# Patient Record
Sex: Female | Born: 1960
Health system: Southern US, Community
[De-identification: ages and names within clinical notes are randomized; demographics above are authoritative.]

## PROBLEM LIST (undated history)

## (undated) DIAGNOSIS — F329 Major depressive disorder, single episode, unspecified: Secondary | ICD-10-CM

## (undated) DIAGNOSIS — F419 Anxiety disorder, unspecified: Secondary | ICD-10-CM

## (undated) DIAGNOSIS — Z86010 Personal history of colonic polyps: Principal | ICD-10-CM

## (undated) DIAGNOSIS — T7840XA Allergy, unspecified, initial encounter: Secondary | ICD-10-CM

## (undated) DIAGNOSIS — J302 Other seasonal allergic rhinitis: Secondary | ICD-10-CM

## (undated) DIAGNOSIS — R7303 Prediabetes: Secondary | ICD-10-CM

## (undated) DIAGNOSIS — F32A Depression, unspecified: Secondary | ICD-10-CM

## (undated) DIAGNOSIS — J45909 Unspecified asthma, uncomplicated: Secondary | ICD-10-CM

## (undated) DIAGNOSIS — E785 Hyperlipidemia, unspecified: Secondary | ICD-10-CM

## (undated) DIAGNOSIS — E559 Vitamin D deficiency, unspecified: Secondary | ICD-10-CM

## (undated) DIAGNOSIS — M1990 Unspecified osteoarthritis, unspecified site: Secondary | ICD-10-CM

## (undated) HISTORY — DX: Unspecified asthma, uncomplicated: J45.909

## (undated) HISTORY — PX: COLONOSCOPY: SHX174

## (undated) HISTORY — DX: Allergy, unspecified, initial encounter: T78.40XA

## (undated) HISTORY — PX: CHEST TUBE INSERTION: SHX231

## (undated) HISTORY — DX: Depression, unspecified: F32.A

## (undated) HISTORY — DX: Personal history of colonic polyps: Z86.010

## (undated) HISTORY — DX: Other seasonal allergic rhinitis: J30.2

## (undated) HISTORY — DX: Prediabetes: R73.03

## (undated) HISTORY — PX: PARTIAL HYSTERECTOMY: SHX80

## (undated) HISTORY — DX: Anxiety disorder, unspecified: F41.9

## (undated) HISTORY — DX: Vitamin D deficiency, unspecified: E55.9

## (undated) HISTORY — DX: Major depressive disorder, single episode, unspecified: F32.9

## (undated) HISTORY — PX: OTHER SURGICAL HISTORY: SHX169

## (undated) HISTORY — DX: Hyperlipidemia, unspecified: E78.5

## (undated) HISTORY — PX: POLYPECTOMY: SHX149

## (undated) HISTORY — DX: Unspecified osteoarthritis, unspecified site: M19.90

---

## 2008-07-27 ENCOUNTER — Emergency Department (HOSPITAL_COMMUNITY): Admission: EM | Admit: 2008-07-27 | Discharge: 2008-07-27 | Payer: Self-pay | Admitting: Emergency Medicine

## 2012-02-16 DIAGNOSIS — Z860100 Personal history of colon polyps, unspecified: Secondary | ICD-10-CM | POA: Insufficient documentation

## 2013-10-14 ENCOUNTER — Other Ambulatory Visit: Payer: Self-pay

## 2013-11-05 ENCOUNTER — Encounter (HOSPITAL_COMMUNITY): Payer: Self-pay | Admitting: Emergency Medicine

## 2013-11-05 ENCOUNTER — Emergency Department (INDEPENDENT_AMBULATORY_CARE_PROVIDER_SITE_OTHER)
Admission: EM | Admit: 2013-11-05 | Discharge: 2013-11-05 | Disposition: A | Discharge status: Home / Self Care | Payer: 59 | Source: Home / Self Care

## 2013-11-05 DIAGNOSIS — S61219A Laceration without foreign body of unspecified finger without damage to nail, initial encounter: Secondary | ICD-10-CM

## 2013-11-05 DIAGNOSIS — M79609 Pain in unspecified limb: Secondary | ICD-10-CM

## 2013-11-05 DIAGNOSIS — M79644 Pain in right finger(s): Secondary | ICD-10-CM

## 2013-11-05 DIAGNOSIS — S61209A Unspecified open wound of unspecified finger without damage to nail, initial encounter: Secondary | ICD-10-CM

## 2013-11-05 DIAGNOSIS — W268XXA Contact with other sharp object(s), not elsewhere classified, initial encounter: Secondary | ICD-10-CM

## 2013-11-05 MED ORDER — BACITRACIN ZINC 500 UNIT/GM EX OINT
TOPICAL_OINTMENT | CUTANEOUS | Status: AC
Start: 2013-11-05 — End: 2013-11-05
  Filled 2013-11-05: qty 0.9

## 2013-11-05 NOTE — Discharge Instructions (Signed)
Laceration Care, Adult °A laceration is a cut or lesion that goes through all layers of the skin and into the tissue just beneath the skin. °TREATMENT  °Some lacerations may not require closure. Some lacerations may not be able to be closed due to an increased risk of infection. It is important to see your caregiver as soon as possible after an injury to minimize the risk of infection and maximize the opportunity for successful closure. °If closure is appropriate, pain medicines may be given, if needed. The wound will be cleaned to help prevent infection. Your caregiver will use stitches (sutures), staples, wound glue (adhesive), or skin adhesive strips to repair the laceration. These tools bring the skin edges together to allow for faster healing and a better cosmetic outcome. However, all wounds will heal with a scar. Once the wound has healed, scarring can be minimized by covering the wound with sunscreen during the day for 1 full year. °HOME CARE INSTRUCTIONS  °For sutures or staples: °· Keep the wound clean and dry. °· If you were given a bandage (dressing), you should change it at least once a day. Also, change the dressing if it becomes wet or dirty, or as directed by your caregiver. °· Wash the wound with soap and water 2 times a day. Rinse the wound off with water to remove all soap. Pat the wound dry with a clean towel. °· After cleaning, apply a thin layer of the antibiotic ointment as recommended by your caregiver. This will help prevent infection and keep the dressing from sticking. °· You may shower as usual after the first 24 hours. Do not soak the wound in water until the sutures are removed. °· Only take over-the-counter or prescription medicines for pain, discomfort, or fever as directed by your caregiver. °· Get your sutures or staples removed as directed by your caregiver. °For skin adhesive strips: °· Keep the wound clean and dry. °· Do not get the skin adhesive strips wet. You may bathe  carefully, using caution to keep the wound dry. °· If the wound gets wet, pat it dry with a clean towel. °· Skin adhesive strips will fall off on their own. You may trim the strips as the wound heals. Do not remove skin adhesive strips that are still stuck to the wound. They will fall off in time. °For wound adhesive: °· You may briefly wet your wound in the shower or bath. Do not soak or scrub the wound. Do not swim. Avoid periods of heavy perspiration until the skin adhesive has fallen off on its own. After showering or bathing, gently pat the wound dry with a clean towel. °· Do not apply liquid medicine, cream medicine, or ointment medicine to your wound while the skin adhesive is in place. This may loosen the film before your wound is healed. °· If a dressing is placed over the wound, be careful not to apply tape directly over the skin adhesive. This may cause the adhesive to be pulled off before the wound is healed. °· Avoid prolonged exposure to sunlight or tanning lamps while the skin adhesive is in place. Exposure to ultraviolet light in the first year will darken the scar. °· The skin adhesive will usually remain in place for 5 to 10 days, then naturally fall off the skin. Do not pick at the adhesive film. °You may need a tetanus shot if: °· You cannot remember when you had your last tetanus shot. °· You have never had a tetanus   shot. If you get a tetanus shot, your arm may swell, get red, and feel warm to the touch. This is common and not a problem. If you need a tetanus shot and you choose not to have one, there is a rare chance of getting tetanus. Sickness from tetanus can be serious. SEEK MEDICAL CARE IF:   You have redness, swelling, or increasing pain in the wound.  You see a red line that goes away from the wound.  You have yellowish-white fluid (pus) coming from the wound.  You have a fever.  You notice a bad smell coming from the wound or dressing.  Your wound breaks open before or  after sutures have been removed.  You notice something coming out of the wound such as wood or glass.  Your wound is on your hand or foot and you cannot move a finger or toe. SEEK IMMEDIATE MEDICAL CARE IF:   Your pain is not controlled with prescribed medicine.  You have severe swelling around the wound causing pain and numbness or a change in color in your arm, hand, leg, or foot.  Your wound splits open and starts bleeding.  You have worsening numbness, weakness, or loss of function of any joint around or beyond the wound.  You develop painful lumps near the wound or on the skin anywhere on your body. MAKE SURE YOU:   Understand these instructions.  Will watch your condition.  Will get help right away if you are not doing well or get worse. Document Released: 02/05/2005 Document Revised: 04/30/2011 Document Reviewed: 08/01/2010 [facility] Patient Information 2015 [place], [place]. This information is not intended to replace advice given to you by your health care provider. Make sure you discuss any questions you have with your health care provider.   Keep covered for 24 hours, then may wash and keep covered with bandaid.

## 2013-11-05 NOTE — ED Provider Notes (Signed)
CSN: [id number]     Arrival date & time 11/05/13  1956 History   None    Chief Complaint  Patient presents with  . Laceration   (Consider location/radiation/quality/duration/timing/severity/associated sxs/prior Treatment) HPI Comments: Patient presents with a right middle finger laceration on distal finger pad. Cut on glass today. "Bleeding would not stop".   Patient is a 53 y.o. female presenting with skin laceration. The history is provided by the patient.  Laceration   History reviewed. No pertinent past medical history. History reviewed. No pertinent past surgical history. No family history on file. History  Substance Use Topics  . Smoking status: Not on file  . Smokeless tobacco: Not on file  . Alcohol Use: Not on file   OB History   Grav Para Term Preterm Abortions TAB SAB Ect Mult Living                 Review of Systems  All other systems reviewed and are negative.   Allergies  Review of patient's allergies indicates no known allergies.  Home Medications   Prior to Admission medications   Not on File   BP 137/76  Pulse 84  Temp(Src) 98.9 F (37.2 C) (Oral)  Resp 14  SpO2 100% Physical Exam  Nursing note and vitals reviewed. Constitutional: She is oriented to person, place, and time. She appears well-developed and well-nourished. No distress.  Neurological: She is alert and oriented to person, place, and time.  Skin: Skin is warm and dry. No rash noted. She is not diaphoretic.  Superficial slice laceration to right middle finger, not currently bleeding. Sensation intact.   Psychiatric: Her behavior is normal.    ED Course  Procedures (including critical care time) Labs Review Labs Reviewed - No data to display  Imaging Review No results found.   MDM   1. Finger laceration, initial encounter   2. Pain of finger of right hand   Superficial, Treat with bandage cover. F/U if worsens or concerns.     Bjorn Pippin, PA-C 11/05/13 2049

## 2013-11-05 NOTE — ED Notes (Signed)
Right middle finger injury, cut finger on glass picture frame, initially would not stop bleeding.  Injury is an avulsion of tissue.  Bleeding is controlled.

## 2013-11-06 NOTE — ED Provider Notes (Signed)
Medical screening examination/treatment/procedure(s) were performed by a resident physician or non-physician practitioner and as the supervising physician I was immediately available for consultation/collaboration.  Lynne Leader, MD    Gregor Hams, MD 11/06/13 [phone]

## 2014-02-25 ENCOUNTER — Other Ambulatory Visit: Payer: Self-pay

## 2014-02-25 DIAGNOSIS — Z1231 Encounter for screening mammogram for malignant neoplasm of breast: Secondary | ICD-10-CM

## 2014-03-08 ENCOUNTER — Ambulatory Visit
Admission: RE | Admit: 2014-03-08 | Discharge: 2014-03-08 | Disposition: A | Discharge status: Home / Self Care | Payer: 59 | Source: Ambulatory Visit

## 2014-03-08 DIAGNOSIS — Z1231 Encounter for screening mammogram for malignant neoplasm of breast: Secondary | ICD-10-CM

## 2014-03-11 ENCOUNTER — Other Ambulatory Visit: Payer: Self-pay | Admitting: Obstetrics and Gynecology

## 2014-03-11 DIAGNOSIS — R928 Other abnormal and inconclusive findings on diagnostic imaging of breast: Secondary | ICD-10-CM

## 2014-03-23 ENCOUNTER — Ambulatory Visit
Admission: RE | Admit: 2014-03-23 | Discharge: 2014-03-23 | Disposition: A | Discharge status: Home / Self Care | Payer: 59 | Source: Ambulatory Visit | Attending: Obstetrics and Gynecology | Admitting: Obstetrics and Gynecology

## 2014-03-23 DIAGNOSIS — R928 Other abnormal and inconclusive findings on diagnostic imaging of breast: Secondary | ICD-10-CM

## 2015-03-03 ENCOUNTER — Telehealth: Payer: Self-pay | Admitting: Internal Medicine

## 2015-03-03 NOTE — Telephone Encounter (Signed)
Received colon and pathology report for patient. Records placed on Dr. Celesta Aver desk for review.

## 2015-04-05 ENCOUNTER — Encounter: Payer: Self-pay | Admitting: Internal Medicine

## 2015-04-27 ENCOUNTER — Other Ambulatory Visit: Payer: Self-pay

## 2015-04-27 DIAGNOSIS — Z1231 Encounter for screening mammogram for malignant neoplasm of breast: Secondary | ICD-10-CM

## 2015-05-24 NOTE — Telephone Encounter (Signed)
Appointment scheduled.

## 2015-06-01 ENCOUNTER — Ambulatory Visit (AMBULATORY_SURGERY_CENTER): Payer: Self-pay | Admitting: *Deleted

## 2015-06-01 VITALS — Ht 62.0 in | Wt 127.0 lb

## 2015-06-01 DIAGNOSIS — Z86010 Personal history of colonic polyps: Secondary | ICD-10-CM

## 2015-06-01 MED ORDER — BISACODYL 5 MG PO TBEC: DELAYED_RELEASE_TABLET | ORAL | Status: DC

## 2015-06-01 MED ORDER — POLYETHYLENE GLYCOL 3350 17 GM/SCOOP PO POWD: ORAL | Status: DC

## 2015-06-01 NOTE — Progress Notes (Signed)
No egg or soy allergy. No anesthesia problems.  No home O2.  No diet meds.  Medications sent in to pharmacy per pt request, so flex plan would pay.  Emmi given.

## 2015-06-03 ENCOUNTER — Ambulatory Visit
Admission: RE | Admit: 2015-06-03 | Discharge: 2015-06-03 | Disposition: A | Discharge status: Home / Self Care | Payer: Managed Care, Other (non HMO) | Source: Ambulatory Visit

## 2015-06-03 DIAGNOSIS — Z1231 Encounter for screening mammogram for malignant neoplasm of breast: Secondary | ICD-10-CM

## 2015-06-15 ENCOUNTER — Encounter: Payer: Self-pay | Admitting: Internal Medicine

## 2015-06-20 ENCOUNTER — Encounter: Payer: Self-pay | Admitting: Internal Medicine

## 2015-06-20 ENCOUNTER — Ambulatory Visit (AMBULATORY_SURGERY_CENTER): Payer: Managed Care, Other (non HMO) | Admitting: Internal Medicine

## 2015-06-20 VITALS — BP 128/76 | HR 76 | Temp 98.2°F | Resp 16 | Ht 62.0 in | Wt 127.0 lb

## 2015-06-20 DIAGNOSIS — D122 Benign neoplasm of ascending colon: Secondary | ICD-10-CM

## 2015-06-20 DIAGNOSIS — K635 Polyp of colon: Secondary | ICD-10-CM

## 2015-06-20 DIAGNOSIS — Z860100 Personal history of colon polyps, unspecified: Secondary | ICD-10-CM

## 2015-06-20 DIAGNOSIS — D125 Benign neoplasm of sigmoid colon: Secondary | ICD-10-CM

## 2015-06-20 HISTORY — DX: Personal history of colonic polyps: Z86.010

## 2015-06-20 HISTORY — DX: Personal history of colon polyps, unspecified: Z86.0100

## 2015-06-20 MED ORDER — SODIUM CHLORIDE 0.9 % IV SOLN: 500.0000 mL | INTRAVENOUS | Status: DC

## 2015-06-20 NOTE — Progress Notes (Signed)
Repoort given to PACU RN, vss

## 2015-06-20 NOTE — Progress Notes (Signed)
Called to room to assist during endoscopic procedure.  Patient ID and intended procedure confirmed with present staff. Received instructions for my participation in the procedure from the performing physician.  

## 2015-06-20 NOTE — Patient Instructions (Signed)
YOU HAD AN ENDOSCOPIC PROCEDURE TODAY AT THE [facility]:   Refer to the procedure report that was given to you for any specific questions about what was found during the examination.  If the procedure report does not answer your questions, please call your gastroenterologist to clarify.  If you requested that your care partner not be given the details of your procedure findings, then the procedure report has been included in a sealed envelope for you to review at your convenience later.  YOU SHOULD EXPECT: Some feelings of bloating in the abdomen. Passage of more gas than usual.  Walking can help get rid of the air that was put into your GI tract during the procedure and reduce the bloating. If you had a lower endoscopy (such as a colonoscopy or flexible sigmoidoscopy) you may notice spotting of blood in your stool or on the toilet paper. If you underwent a bowel prep for your procedure, you may not have a normal bowel movement for a few days.  Please Note:  You might notice some irritation and congestion in your nose or some drainage.  This is from the oxygen used during your procedure.  There is no need for concern and it should clear up in a day or so.  SYMPTOMS TO REPORT IMMEDIATELY:   Following lower endoscopy (colonoscopy or flexible sigmoidoscopy):  Excessive amounts of blood in the stool  Significant tenderness or worsening of abdominal pains  Swelling of the abdomen that is new, acute  Fever of 100F or higher   For urgent or emergent issues, a gastroenterologist can be reached at any hour by calling [phone].   DIET: Your first meal following the procedure should be a small meal and then it is ok to progress to your normal diet. Heavy or fried foods are harder to digest and may make you feel nauseous or bloated.  Likewise, meals heavy in dairy and vegetables can increase bloating.  Drink plenty of fluids but you should avoid alcoholic beverages for 24  hours.  ACTIVITY:  You should plan to take it easy for the rest of today and you should NOT DRIVE or use heavy machinery until tomorrow (because of the sedation medicines used during the test).    FOLLOW UP: Our staff will call the number listed on your records the next business day following your procedure to check on you and address any questions or concerns that you may have regarding the information given to you following your procedure. If we do not reach you, we will leave a message.  However, if you are feeling well and you are not experiencing any problems, there is no need to return our call.  We will assume that you have returned to your regular daily activities without incident.  If any biopsies were taken you will be contacted by phone or by letter within the next 1-3 weeks.  Please call us at [phone] if you have not heard about the biopsies in 3 weeks.    SIGNATURES/CONFIDENTIALITY: You and/or your care partner have signed paperwork which will be entered into your electronic medical record.  These signatures attest to the fact that that the information above on your After Visit Summary has been reviewed and is understood.  Full responsibility of the confidentiality of this discharge information lies with you and/or your care-partner.     Handout was given to your care partner on polyps. You may resume your current medications today. Await biopsy results. Please call   if any questions or concerns.   

## 2015-06-20 NOTE — Op Note (Addendum)
[facility] Patient Name: Kathryn Garcia Procedure Date: 06/20/2015 8:13 AM MRN: [id number] Endoscopist: Gatha Mayer , MD Age: 55 Date of Birth: 07/28/1960 Gender: Female Procedure:                Colonoscopy Indications:              High risk colon cancer surveillance: Personal                            history of colonic polyps Medicines:                Propofol per Anesthesia, Monitored Anesthesia Care Procedure:                Pre-Anesthesia Assessment:                           - Prior to the procedure, a History and Physical                            was performed, and patient medications and                            allergies were reviewed. The patient's tolerance of                            previous anesthesia was also reviewed. The risks                            and benefits of the procedure and the sedation                            options and risks were discussed with the patient.                            All questions were answered, and informed consent                            was obtained. Prior Anticoagulants: The patient has                            taken no previous anticoagulant or antiplatelet                            agents. ASA Grade Assessment: II - A patient with                            mild systemic disease. After reviewing the risks                            and benefits, the patient was deemed in                            satisfactory condition to undergo the procedure.  After obtaining informed consent, the colonoscope                            was passed under direct vision. Throughout the                            procedure, the patient's blood pressure, pulse, and                            oxygen saturations were monitored continuously. The                            Model CF-HQ190L [id number]) scope was introduced                            through the anus and advanced to the the cecum,                             identified by appendiceal orifice and ileocecal                            valve. The ileocecal valve and the rectum were                            photographed. The quality of the bowel preparation                            was excellent. The colonoscopy was performed                            without difficulty. The patient tolerated the                            procedure well. The bowel preparation used was                            Miralax. Scope In: 8:29:03 AM Scope Out: 8:39:52 AM Scope Withdrawal Time: 0 hours 9 minutes 10 seconds  Total Procedure Duration: 0 hours 10 minutes 49 seconds  Findings:                 Two sessile polyps were found in the ascending                            colon. The polyps were 3 to 4 mm in size. These                            polyps were removed with a cold snare. Resection                            and retrieval were complete. Verification of                            patient identification for the specimen was done.  Estimated blood loss was minimal.                           A 2 mm polyp was found in the sigmoid colon. The                            polyp was sessile. The polyp was removed with a                            cold biopsy forceps. Resection and retrieval were                            complete. Verification of patient identification                            for the specimen was done. Estimated blood loss was                            minimal.                           The exam was otherwise without abnormality on                            direct and retroflexion views. Complications:            No immediate complications. Estimated blood loss:                            None. Estimated Blood Loss:     Estimated blood loss: none. Impression:               - Two 3 to 4 mm polyps in the ascending colon,                            removed with a cold snare. Resected and  retrieved.                           - One 2 mm polyp in the sigmoid colon, removed with                            a cold biopsy forceps. Resected and retrieved.                           - The examination was otherwise normal on direct                            and retroflexion views. Recommendation:           - Patient has a contact number available for                            emergencies. The signs and symptoms of potential  delayed complications were discussed with the                            patient. Return to normal activities tomorrow.                            Written discharge instructions were provided to the                            patient.                           - Resume previous diet.                           - Continue present medications.                           - Repeat colonoscopy is recommended for                            surveillance. SHE HAS HX 12 MM RECTAL ADENOMA 2013                            The colonoscopy date will be determined after                            pathology results from today's exam become                            available for review. Gatha Mayer, MD 06/20/2015 8:46:48 AM This report has been signed electronically. CC Letter to:             Velna Hatchet Addendum Number: 1   Addendum Date: 06/20/2015 3:22:50 PM      Normal perianal and rectal exams Gatha Mayer, MD 06/20/2015 3:23:02 PM This report has been signed electronically.

## 2015-06-20 NOTE — Progress Notes (Signed)
No problems noted in the recovery room. maw 

## 2015-06-21 ENCOUNTER — Telehealth: Payer: Self-pay | Admitting: *Deleted

## 2015-06-21 NOTE — Telephone Encounter (Signed)
  Follow up Call-  Call back number 06/20/2015  Post procedure Call Back phone  # [phone]  Permission to leave phone message Yes     Patient questions:  Do you have a fever, pain , or abdominal swelling? No. Pain Score  0 *  Have you tolerated food without any problems? Yes.    Have you been able to return to your normal activities? Yes.    Do you have any questions about your discharge instructions: Diet   No. Medications  No. Follow up visit  No.  Do you have questions or concerns about your Care? No.  Actions: * If pain score is 4 or above: No action needed, pain <4.

## 2015-06-26 ENCOUNTER — Encounter: Payer: Self-pay | Admitting: Internal Medicine

## 2015-06-26 NOTE — Progress Notes (Signed)
Quick Note:  1 diminutive adenoma and 2 mucosal polyps recall colonoscopy 2022 ______

## 2015-07-27 ENCOUNTER — Other Ambulatory Visit (HOSPITAL_COMMUNITY): Payer: Self-pay | Admitting: Internal Medicine

## 2015-07-27 ENCOUNTER — Ambulatory Visit (HOSPITAL_COMMUNITY)
Admission: RE | Admit: 2015-07-27 | Discharge: 2015-07-27 | Disposition: A | Discharge status: Home / Self Care | Payer: Managed Care, Other (non HMO) | Source: Ambulatory Visit | Attending: Vascular Surgery | Admitting: Vascular Surgery

## 2015-07-27 DIAGNOSIS — R600 Localized edema: Secondary | ICD-10-CM

## 2015-07-27 DIAGNOSIS — F329 Major depressive disorder, single episode, unspecified: Secondary | ICD-10-CM | POA: Insufficient documentation

## 2015-07-27 DIAGNOSIS — F419 Anxiety disorder, unspecified: Secondary | ICD-10-CM | POA: Insufficient documentation

## 2015-08-24 ENCOUNTER — Encounter (HOSPITAL_COMMUNITY): Payer: Managed Care, Other (non HMO)

## 2016-04-02 ENCOUNTER — Encounter (INDEPENDENT_AMBULATORY_CARE_PROVIDER_SITE_OTHER): Payer: Self-pay

## 2016-04-02 ENCOUNTER — Ambulatory Visit (INDEPENDENT_AMBULATORY_CARE_PROVIDER_SITE_OTHER): Payer: BLUE CROSS/BLUE SHIELD | Admitting: Internal Medicine

## 2016-04-02 ENCOUNTER — Encounter: Payer: Self-pay | Admitting: Internal Medicine

## 2016-04-02 VITALS — BP 128/80 | HR 84 | Ht 62.0 in | Wt 134.1 lb

## 2016-04-02 DIAGNOSIS — R140 Abdominal distension (gaseous): Secondary | ICD-10-CM | POA: Diagnosis not present

## 2016-04-02 DIAGNOSIS — R635 Abnormal weight gain: Secondary | ICD-10-CM

## 2016-04-02 NOTE — Patient Instructions (Addendum)
  You have been scheduled for an abdominal ultrasound at [facility] on 04/12/16 at 8:00AM. Please arrive 20 minutes prior to your appointment for registration. Make certain not to have anything to eat or drink 6 hours prior to your appointment. Should you need to reschedule your appointment, please contact radiology at [phone].  Bring a snack and water bottle at least 32 oz's.  They will do abdominal u/s first and then 1.5 hours later they will do the transvaginal ultrasound.  There address is :  [facility], [address] [place]  , [address]    I appreciate the opportunity to care for you. Silvano Rusk, MD, FACG     CALL HER HOME/MOBILE # WITH RESULTS PLEASE.

## 2016-04-02 NOTE — Progress Notes (Signed)
[place]ia 56 y.o. [birth year] [id number]  Assessment & Plan:    Encounter Diagnoses  Name Primary?  . Abdominal bloating Yes  . Weight gain    She is quite distressed about bloating and abdominal distention. Some weight gain. I reassured as best I can. I think given her c/o and concerns an abdominal + pelvic US to rule out ascites or ovarian lesions reasonable.   She has lower abdominal adipose tissue and it may be that is what is causing her problems.  Probiotic reasonable to try if studies negative.  2017 colonoscopy 1 diminutive adenoma  Cc;HOLWERDA, SCOTT, MD     Subjective:   Chief Complaint: bloating  HPI Here w/ c/o chronic bloating sxs. Variable abdominal girth throughout the day in no particular pattern. PCP suggested probiotics - she wanted an evaluation and came to see me. Last colonoscopy 7-8 mos ago - diminutive adenoma. No bowel changes, excess belching or flatus. Gas-ex no help.s/p hysterectomy but no oopherectomy.She is very distressed by these sxs. No new medications. Moves bowels several x a week but not qd and never has.  Wt Readings from Last 3 Encounters:  04/02/16 134 lb 2 oz (60.8 kg)  06/20/15 127 lb (57.6 kg)  06/01/15 127 lb (57.6 kg)   Current Meds  Medication Sig  . albuterol (PROVENTIL HFA;VENTOLIN HFA) 108 (90 Base) MCG/ACT inhaler Inhale 1 puff into the lungs every 6 (six) hours as needed for wheezing or shortness of breath.  . ALPRAZolam (XANAX) 0.25 MG tablet Take 0.25 mg by mouth 2 (two) times daily as needed for anxiety.  Marland Kitchen atorvastatin (LIPITOR) 10 MG tablet daily.  . cholecalciferol (VITAMIN D) 1000 units tablet Take 2,000 Units by mouth daily.  . Emollient (EUCERIN CALMING DAILY MOIST) CREA as needed.  . fluticasone (FLONASE) 50 MCG/ACT nasal spray Place into both nostrils daily.  Marland Kitchen loratadine (CLARITIN) 10 MG tablet Take 10 mg by mouth daily.  . Multiple Vitamins-Minerals (MULTIVITAMIN ADULT PO) Take by  mouth.  . sertraline (ZOLOFT) 100 MG tablet daily.   No Known Allergies  Past Medical History:  Diagnosis Date  . Anxiety   . Arthritis    knees  . Asthma   . Depression   . Personal history of colonic polyps 06/20/2015  . Seasonal allergies   . Vitamin D deficiency    Past Surgical History:  Procedure Laterality Date  . COLONOSCOPY    . PARTIAL HYSTERECTOMY      Review of Systems As above  Objective:   Physical Exam BP 128/80   Pulse 84   Ht 5\' 2"  (1.575 m)   Wt 134 lb 2 oz (60.8 kg)   BMI 24.53 kg/m  Eyes are anicteric Abdomen is soft - lower abdomen protrudes> upper abdomen w/ adipose tissue - midline lwer abd scar no hernia and she is nontedner No peripheral edema She is alert and oriented x3 Somewhat anxious I think

## 2016-04-12 ENCOUNTER — Other Ambulatory Visit: Payer: BLUE CROSS/BLUE SHIELD

## 2016-06-25 ENCOUNTER — Emergency Department (HOSPITAL_COMMUNITY)
Admission: EM | Admit: 2016-06-25 | Discharge: 2016-06-26 | Disposition: A | Discharge status: Home / Self Care | Payer: BLUE CROSS/BLUE SHIELD | Attending: Emergency Medicine | Admitting: Emergency Medicine

## 2016-06-25 ENCOUNTER — Encounter (HOSPITAL_COMMUNITY): Payer: Self-pay

## 2016-06-25 DIAGNOSIS — Z79899 Other long term (current) drug therapy: Secondary | ICD-10-CM | POA: Insufficient documentation

## 2016-06-25 DIAGNOSIS — R0602 Shortness of breath: Secondary | ICD-10-CM | POA: Diagnosis not present

## 2016-06-25 DIAGNOSIS — J939 Pneumothorax, unspecified: Secondary | ICD-10-CM | POA: Diagnosis not present

## 2016-06-25 DIAGNOSIS — R9389 Abnormal findings on diagnostic imaging of other specified body structures: Secondary | ICD-10-CM

## 2016-06-25 DIAGNOSIS — J4541 Moderate persistent asthma with (acute) exacerbation: Secondary | ICD-10-CM | POA: Insufficient documentation

## 2016-06-25 DIAGNOSIS — J439 Emphysema, unspecified: Secondary | ICD-10-CM

## 2016-06-25 DIAGNOSIS — R918 Other nonspecific abnormal finding of lung field: Secondary | ICD-10-CM | POA: Insufficient documentation

## 2016-06-25 DIAGNOSIS — R0600 Dyspnea, unspecified: Secondary | ICD-10-CM | POA: Diagnosis not present

## 2016-06-25 LAB — CBC WITH DIFFERENTIAL/PLATELET
Basophils Absolute: 0 10*3/uL (ref 0.0–0.1)
Basophils Relative: 0 %
EOS PCT: 3 %
Eosinophils Absolute: 0.3 10*3/uL (ref 0.0–0.7)
HCT: 40.8 % (ref 36.0–46.0)
Hemoglobin: 13 g/dL (ref 12.0–15.0)
LYMPHS ABS: 2.2 10*3/uL (ref 0.7–4.0)
LYMPHS PCT: 27 %
MCH: 27.2 pg (ref 26.0–34.0)
MCHC: 31.9 g/dL (ref 30.0–36.0)
MCV: 85.4 fL (ref 78.0–100.0)
Monocytes Absolute: 0.5 10*3/uL (ref 0.1–1.0)
Monocytes Relative: 6 %
Neutro Abs: 5.2 10*3/uL (ref 1.7–7.7)
Neutrophils Relative %: 64 %
PLATELETS: 213 10*3/uL (ref 150–400)
RBC: 4.78 MIL/uL (ref 3.87–5.11)
RDW: 13.9 % (ref 11.5–15.5)
WBC: 8.1 10*3/uL (ref 4.0–10.5)

## 2016-06-25 LAB — PROTIME-INR
INR: 0.99
Prothrombin Time: 13.1 seconds (ref 11.4–15.2)

## 2016-06-25 LAB — BASIC METABOLIC PANEL
Anion gap: 9 (ref 5–15)
BUN: 16 mg/dL (ref 6–20)
CHLORIDE: 109 mmol/L (ref 101–111)
CO2: 24 mmol/L (ref 22–32)
Calcium: 9.7 mg/dL (ref 8.9–10.3)
Creatinine, Ser: 0.77 mg/dL (ref 0.44–1.00)
GFR calc Af Amer: >60 mL/min (ref >60)
GFR calc non Af Amer: >60 mL/min (ref >60)
GLUCOSE: 120 mg/dL — AB (ref 65–99)
POTASSIUM: 4.5 mmol/L (ref 3.5–5.1)
SODIUM: 142 mmol/L (ref 135–145)

## 2016-06-25 MED ORDER — IPRATROPIUM-ALBUTEROL 0.5-2.5 (3) MG/3ML IN SOLN
3.0000 mL | Freq: Once | RESPIRATORY_TRACT | Status: AC
  Administered 2016-06-25: 3 mL via RESPIRATORY_TRACT
  Filled 2016-06-25: qty 3

## 2016-06-25 MED ORDER — IOPAMIDOL (ISOVUE-370) INJECTION 76%
INTRAVENOUS | Status: AC
  Administered 2016-06-26: 100 mL
  Filled 2016-06-25: qty 100

## 2016-06-25 NOTE — ED Triage Notes (Signed)
Pt arrived via [facility] from [facility] c/o SOB pt arrives unlabored on 2L O2 [place].

## 2016-06-25 NOTE — ED Notes (Signed)
Pt has chest x-ray on disk at bedside from urgent care

## 2016-06-26 ENCOUNTER — Emergency Department (HOSPITAL_COMMUNITY): Payer: BLUE CROSS/BLUE SHIELD

## 2016-06-26 DIAGNOSIS — R0600 Dyspnea, unspecified: Secondary | ICD-10-CM | POA: Diagnosis not present

## 2016-06-26 LAB — BRAIN NATRIURETIC PEPTIDE: B NATRIURETIC PEPTIDE 5: 5.5 pg/mL (ref 0.0–100.0)

## 2016-06-26 LAB — TROPONIN I: Troponin I: <0.03 ng/mL

## 2016-06-26 MED ORDER — ALBUTEROL SULFATE HFA 108 (90 BASE) MCG/ACT IN AERS: 1.0000 | INHALATION_SPRAY | Freq: Four times a day (QID) | RESPIRATORY_TRACT | 0 refills | Status: AC | PRN

## 2016-06-26 MED ORDER — HYDROCODONE-HOMATROPINE 5-1.5 MG/5ML PO SYRP: 5.0000 mL | ORAL_SOLUTION | Freq: Four times a day (QID) | ORAL | 0 refills | Status: DC | PRN

## 2016-06-26 MED ORDER — BENZONATATE 100 MG PO CAPS: 100.0000 mg | ORAL_CAPSULE | Freq: Three times a day (TID) | ORAL | 0 refills | Status: DC

## 2016-06-26 MED ORDER — PREDNISONE 20 MG PO TABS
60.0000 mg | ORAL_TABLET | Freq: Once | ORAL | Status: AC
  Administered 2016-06-26: 60 mg via ORAL
  Filled 2016-06-26: qty 3

## 2016-06-26 MED ORDER — IPRATROPIUM-ALBUTEROL 0.5-2.5 (3) MG/3ML IN SOLN: 3.0000 mL | RESPIRATORY_TRACT | 1 refills | Status: AC | PRN

## 2016-06-26 MED ORDER — PREDNISONE 20 MG PO TABS: ORAL_TABLET | ORAL | 0 refills | Status: DC

## 2016-06-26 NOTE — ED Provider Notes (Addendum)
[facility] DEPT Provider Note   CSN: [id number] Arrival date & time: 06/25/16  2029     History   Chief Complaint Chief Complaint  Patient presents with  . Shortness of Breath    HPI Kathryn Garcia is a 56 y.o. female.  HPI Patient has a long history of asthma. She reports for about the past 3 weeks she has had very persistent shortness of breath and felt like she needed to use her inhaler very frequently. She denies specific chest pain but reports it has felt tight trying to breathe. Usually the symptoms improve with her inhaler and start to abate but this is chronic and persistent and worse. She denies fever or chills. No abdominal pain nausea vomiting. No lower extremity swelling or pain. She does report some upper cold symptoms with postnasal drip which seems to stimulate the symptoms a lot more. Due to the persistent worsening in length of symptoms she sought treatment at urgent care. Chest x-ray was done which showed concerning area for possible pneumothorax versus bleb. She was referred to the emergency department for CT scan of the chest. Of note, the patient reports that when she was quite young as a teenager she developed a spontaneous pneumothorax. At that time, she was told it was because she was so tall and thin. Past Medical History:  Diagnosis Date  . Anxiety   . Arthritis    knees  . Asthma   . Depression   . Personal history of colonic polyps 06/20/2015  . Seasonal allergies   . Vitamin D deficiency     Patient Active Problem List   Diagnosis Date Noted  . Personal history of colonic polyps 02/16/2012    Past Surgical History:  Procedure Laterality Date  . COLONOSCOPY    . PARTIAL HYSTERECTOMY      OB History    No data available       Home Medications    Prior to Admission medications   Medication Sig Start Date End Date Taking? Authorizing Provider  albuterol (PROVENTIL HFA;VENTOLIN HFA) 108 (90 Base) MCG/ACT inhaler Inhale 1 puff into the  lungs every 6 (six) hours as needed for wheezing or shortness of breath.   Yes [provider]  amoxicillin (AMOXIL) 500 MG capsule Take 500 mg by mouth 3 (three) times daily. 06/18/16  Yes [provider]  loratadine (CLARITIN) 10 MG tablet Take 10 mg by mouth daily.   Yes [provider]  sertraline (ZOLOFT) 100 MG tablet Take 100 mg by mouth daily.  03/23/16  Yes [provider]  albuterol (PROVENTIL HFA;VENTOLIN HFA) 108 (90 Base) MCG/ACT inhaler Inhale 1-2 puffs into the lungs every 6 (six) hours as needed for wheezing or shortness of breath. 06/26/16   Charlesetta Shanks, MD  benzonatate (TESSALON) 100 MG capsule Take 1 capsule (100 mg total) by mouth every 8 (eight) hours. 06/26/16   Charlesetta Shanks, MD  ipratropium-albuterol (DUONEB) 0.5-2.5 (3) MG/3ML SOLN Take 3 mLs by nebulization every 4 (four) hours as needed. 06/26/16   Charlesetta Shanks, MD  predniSONE (DELTASONE) 20 MG tablet 2 tabs po daily x 4 days 06/26/16   Charlesetta Shanks, MD    Family History Family History  Problem Relation Age of Onset  . Colon cancer Neg Hx     Social History Social History  Substance Use Topics  . Smoking status: Never Smoker  . Smokeless tobacco: Never Used  . Alcohol use 0.0 oz/week     Comment: socially  Allergies   Patient has no known allergies.   Review of Systems Review of Systems  10 Systems reviewed and are negative for acute change except as noted in the HPI.  Physical Exam Updated Vital Signs BP (!) 149/73   Pulse 95   Temp 98 F (36.7 C) (Oral)   Resp 15   Ht 5\' 2"  (1.575 m)   Wt 137 lb (62.1 kg)   SpO2 98%   BMI 25.06 kg/m   Physical Exam  Constitutional: She is oriented to person, place, and time. She appears well-developed and well-nourished. No distress.  HENT:  Head: Normocephalic and atraumatic.  Nose: Nose normal.  Mouth/Throat: Oropharynx is clear and moist.  Eyes: Conjunctivae and EOM are normal.  Neck: Neck supple.    Cardiovascular: Normal rate and regular rhythm.   No murmur heard. Pulmonary/Chest: Effort normal and breath sounds normal. No respiratory distress. She has no wheezes. She has no rales.  Abdominal: Soft. She exhibits no distension. There is no tenderness.  Musculoskeletal: She exhibits no edema or tenderness.  Neurological: She is alert and oriented to person, place, and time. No cranial nerve deficit. She exhibits normal muscle tone.  Skin: Skin is warm and dry.  Psychiatric: She has a normal mood and affect.  Nursing note and vitals reviewed.    ED Treatments / Results  Labs (all labs ordered are listed, but only abnormal results are displayed) Labs Reviewed  BASIC METABOLIC PANEL - Abnormal; Notable for the following:       Result Value   Glucose, Bld 120 (*)    All other components within normal limits  CBC WITH DIFFERENTIAL/PLATELET  PROTIME-INR  BRAIN NATRIURETIC PEPTIDE  TROPONIN I    EKG  EKG Interpretation  Date/Time:  Monday Jun 25 2016 20:33:09 EDT Ventricular Rate:  89 PR Interval:    QRS Duration: 88 QT Interval:  340 QTC Calculation: 414 R Axis:   56 Text Interpretation:  Sinus rhythm Borderline T wave abnormalities agree. no old comparison. Confirmed by Johnney Killian, MD, Jeannie Done [id number]) on 06/25/2016 10:38:36 PM       Radiology Ct Angio Chest Pe W/cm &/or Wo Cm  Result Date: 06/26/2016 CLINICAL DATA:  Acute onset of dyspnea. Question of abnormal chest radiograph. Initial encounter. EXAM: CT ANGIOGRAPHY CHEST WITH CONTRAST TECHNIQUE: Multidetector CT imaging of the chest was performed using the standard protocol during bolus administration of intravenous contrast. Multiplanar CT image reconstructions and MIPs were obtained to evaluate the vascular anatomy. CONTRAST:  58 mL of Isovue 370 IV contrast COMPARISON:  None. FINDINGS: Cardiovascular:  There is no evidence of pulmonary embolus. The heart is normal in size. The thoracic aorta is grossly unremarkable. The  great vessels are grossly unremarkable in appearance. Mediastinum/Nodes: The mediastinum is unremarkable in appearance. No mediastinal lymphadenopathy is seen. No pericardial effusion is identified. The visualized portions of the thyroid gland are unremarkable. No axillary lymphadenopathy is appreciated. Lungs/Pleura: Mild bibasilar atelectasis is noted. A large bleb is noted at the right lung apex. No pleural effusion or pneumothorax is seen. No masses are identified. Upper Abdomen: The visualized portions of the liver and spleen are grossly unremarkable. A left renal cyst is noted. The visualized portions of the adrenal glands are grossly unremarkable. Musculoskeletal: No acute osseous abnormalities are identified. The visualized musculature is unremarkable in appearance. Review of the MIP images confirms the above findings. IMPRESSION: 1. No evidence of pulmonary embolus. 2. Mild bibasilar atelectasis noted. Large bleb at the right lung apex. 3. Left  renal cyst noted. Electronically Signed   By: Garald Balding M.D.   On: 06/26/2016 00:44    Procedures Procedures (including critical care time)  Medications Ordered in ED Medications  predniSONE (DELTASONE) tablet 60 mg (not administered)  ipratropium-albuterol (DUONEB) 0.5-2.5 (3) MG/3ML nebulizer solution 3 mL (3 mLs Nebulization Given 06/25/16 2342)  iopamidol (ISOVUE-370) 76 % injection (100 mLs  Contrast Given 06/26/16 0016)     Initial Impression / Assessment and Plan / ED Course  I have reviewed the triage vital signs and the nursing notes.  Pertinent labs & imaging results that were available during my care of the patient were reviewed by me and considered in my medical decision making (see chart for details).      Final Clinical Impressions(s) / ED Diagnoses   Final diagnoses:  Moderate persistent asthma with exacerbation  Abnormal CXR  Bleb, lung ([place])   Patient is alert and nontoxic. She is stable appearance. Plan will be to  proceed with CT chest to rule out indeterminate finding on plain film chest x-ray of pneumothorax versus pneumatocele. Patient reports she has perceive her asthma to be worsening over 2 weeks duration. CT shows a bleb but no pneumothorax. No other acute finding. Plan will be for prednisone taper and DuoNeb every 4 hours, Tessalon Perles and Hycodan as needed for cough. Patient is alert and nontoxic. New Prescriptions New Prescriptions   ALBUTEROL (PROVENTIL HFA;VENTOLIN HFA) 108 (90 BASE) MCG/ACT INHALER    Inhale 1-2 puffs into the lungs every 6 (six) hours as needed for wheezing or shortness of breath.   BENZONATATE (TESSALON) 100 MG CAPSULE    Take 1 capsule (100 mg total) by mouth every 8 (eight) hours.   IPRATROPIUM-ALBUTEROL (DUONEB) 0.5-2.5 (3) MG/3ML SOLN    Take 3 mLs by nebulization every 4 (four) hours as needed.   PREDNISONE (DELTASONE) 20 MG TABLET    2 tabs po daily x 4 days     Charlesetta Shanks, MD 06/26/16 [id number]    Charlesetta Shanks, MD 06/26/16 [phone]

## 2016-06-26 NOTE — ED Notes (Signed)
Pt stable, ambulatory, states understanding of discharge iinstructions

## 2016-06-26 NOTE — Discharge Instructions (Signed)
Your CT scan shows a bleb. A bleb is a area where the lung has created a small, walled air pocket. These occur sometimes in patients who have asthma or emphysema.

## 2016-07-11 DIAGNOSIS — Z6825 Body mass index (BMI) 25.0-25.9, adult: Secondary | ICD-10-CM | POA: Diagnosis not present

## 2016-07-11 DIAGNOSIS — J439 Emphysema, unspecified: Secondary | ICD-10-CM | POA: Diagnosis not present

## 2016-07-11 DIAGNOSIS — J45901 Unspecified asthma with (acute) exacerbation: Secondary | ICD-10-CM | POA: Diagnosis not present

## 2016-07-25 ENCOUNTER — Ambulatory Visit (INDEPENDENT_AMBULATORY_CARE_PROVIDER_SITE_OTHER): Payer: BLUE CROSS/BLUE SHIELD | Admitting: Pulmonary Disease

## 2016-07-25 ENCOUNTER — Encounter: Payer: Self-pay | Admitting: Pulmonary Disease

## 2016-07-25 VITALS — BP 122/72 | HR 86 | Ht 62.0 in | Wt 138.8 lb

## 2016-07-25 DIAGNOSIS — J439 Emphysema, unspecified: Secondary | ICD-10-CM | POA: Diagnosis not present

## 2016-07-25 DIAGNOSIS — J4550 Severe persistent asthma, uncomplicated: Secondary | ICD-10-CM | POA: Diagnosis not present

## 2016-07-25 DIAGNOSIS — R0602 Shortness of breath: Secondary | ICD-10-CM

## 2016-07-25 DIAGNOSIS — J45909 Unspecified asthma, uncomplicated: Secondary | ICD-10-CM | POA: Insufficient documentation

## 2016-07-25 LAB — NITRIC OXIDE: NITRIC OXIDE: 43

## 2016-07-25 MED ORDER — FLUTICASONE-SALMETEROL 232-14 MCG/ACT IN AEPB: 1.0000 | INHALATION_SPRAY | Freq: Two times a day (BID) | RESPIRATORY_TRACT | 5 refills | Status: AC

## 2016-07-25 NOTE — Progress Notes (Signed)
Kathryn Garcia    [id number]    1960-07-11  Primary Care Physician:Velna Hatchet, MD  Referring Physician: Velna Hatchet, [place] [place], [place] [address]  Chief complaint:  Consult for evaluation of lung bleb, asthma  HPI: Kathryn Garcia is a 56 year old with history of childhood asthma. She moved to [place] from [place] 3 years ago and notes worsening dyspnea, daily symptoms of wheezing. She needs to use her albuterol inhaler several times a day. She does not have nocturnal awakenings. She was evaluated in the ED on 5/8 for worsening dyspnea and was noted to have a large right apical bleb. For her asthma exacerbation she was given a prednisone taper, Tessalon and Hycodan. She reports that the dyspnea is improved somewhat but she still requiring her albuterol rescue inhaler frequently. She has seasonal allergies with rhinitis, postnasal drip. There are no GERD symptoms.  She has history of spontaneous pneumothorax on the right at the age of 61 which was treated with a chest tube. There have been no recurrences of pneumothorax and there is no history of lung issues in her family.  Pets:No pets, no birds Occupation: Network engineer Exposures:no mold issues, hot tub. Has carpets at home, which she cleans every week. Smoking history: None. No exposure to second hand smoke.  Outpatient Encounter Prescriptions as of 07/25/2016  Medication Sig  . albuterol (PROVENTIL HFA;VENTOLIN HFA) 108 (90 Base) MCG/ACT inhaler Inhale 1-2 puffs into the lungs every 6 (six) hours as needed for wheezing or shortness of breath.  . benzonatate (TESSALON) 100 MG capsule Take by mouth 3 (three) times daily as needed for cough.  Marland Kitchen ipratropium-albuterol (DUONEB) 0.5-2.5 (3) MG/3ML SOLN Take 3 mLs by nebulization every 4 (four) hours as needed.  . loratadine (CLARITIN) 10 MG tablet Take 10 mg by mouth daily.  . sertraline (ZOLOFT) 100 MG tablet Take 100 mg by mouth daily.   .  [DISCONTINUED] albuterol (PROVENTIL HFA;VENTOLIN HFA) 108 (90 Base) MCG/ACT inhaler Inhale 1 puff into the lungs every 6 (six) hours as needed for wheezing or shortness of breath.  . [DISCONTINUED] amoxicillin (AMOXIL) 500 MG capsule Take 500 mg by mouth 3 (three) times daily.  . [DISCONTINUED] benzonatate (TESSALON) 100 MG capsule Take 1 capsule (100 mg total) by mouth every 8 (eight) hours.  . [DISCONTINUED] HYDROcodone-homatropine (HYCODAN) 5-1.5 MG/5ML syrup Take 5 mLs by mouth every 6 (six) hours as needed for cough.  . [DISCONTINUED] predniSONE (DELTASONE) 20 MG tablet 2 tabs po daily x 4 days   No facility-administered encounter medications on file as of 07/25/2016.     Allergies as of 07/25/2016  . (No Known Allergies)    Past Medical History:  Diagnosis Date  . Anxiety   . Arthritis    knees  . Asthma   . Depression   . Personal history of colonic polyps 06/20/2015  . Seasonal allergies   . Vitamin D deficiency     Past Surgical History:  Procedure Laterality Date  . COLONOSCOPY    . PARTIAL HYSTERECTOMY      Family History  Problem Relation Age of Onset  . Diabetes Mother   . Arthritis Mother   . Arthritis Maternal Grandmother   . Colon cancer Neg Hx     Social History   Social History  . Marital status: Divorced    Spouse name: N/A  . Number of children: N/A  . Years of education: N/A   Occupational History  . Not  on file.   Social History Main Topics  . Smoking status: Never Smoker  . Smokeless tobacco: Never Used  . Alcohol use 0.0 oz/week     Comment: socially  . Drug use: No  . Sexual activity: Not on file   Other Topics Concern  . Not on file   Social History Narrative  . No narrative on file    Review of systems: Review of Systems  Constitutional: Negative for fever and chills.  HENT: Negative.   Eyes: Negative for blurred vision.  Respiratory: as per HPI  Cardiovascular: Negative for chest pain and palpitations.  Gastrointestinal:  Negative for vomiting, diarrhea, blood per rectum. Genitourinary: Negative for dysuria, urgency, frequency and hematuria.  Musculoskeletal: Negative for myalgias, back pain and joint pain.  Skin: Negative for itching and rash.  Neurological: Negative for dizziness, tremors, focal weakness, seizures and loss of consciousness.  Endo/Heme/Allergies: Negative for environmental allergies.  Psychiatric/Behavioral: Negative for depression, suicidal ideas and hallucinations.  All other systems reviewed and are negative.  Physical Exam: Blood pressure 122/72, pulse 86, height 5\' 2"  (1.575 m), weight 138 lb 12.8 oz (63 kg), SpO2 96 %. Gen:      No acute distress HEENT:  EOMI, sclera anicteric Neck:     No masses; no thyromegaly Lungs:    Clear to auscultation bilaterally; normal respiratory effort CV:         Regular rate and rhythm; no murmurs Abd:      + bowel sounds; soft, non-tender; no palpable masses, no distension Ext:    No edema; adequate peripheral perfusion Skin:      Warm and dry; no rash Neuro: alert and oriented x 3 Psych: normal mood and affect  Data Reviewed: FENO 07/25/16- 43  CBC 06/25/16-WBC 8.1, Eosinophils 3%. Absolute eos count 243  CTA 06/26/16-no pulmonary embolus, mild basilar atelectasis. Last bleb at right lung apex. Renal cyst. I reviewed her images personally.  Assessment:  Lung bleb I had reviewed her CT scan which shows a solitary large bleb in the right lung and a renal cyst. There is no evidence of multiple cysts in the lung suggestive of diseases such as Birt Hogg Dube syndrome, LAM. There are no skin lesions, family history of lung issues or smoking history. She has had a spontaneous pneumothorax on the right about 30 years ago and the bleb may have been the cause of the prior pneumothorax. As there is no recurrence we'll continue to monitor her for now with no active interventions. We'll check alpha-1 antitrypsin levels  Asthma Appears to be poorly controlled  after her move to [place] 3 years ago. FENO is elevated in the office today indicating ongoing airway inflammation. She will benefit from controller inhaled medications. Give the cost involved she is requesting a generic inhaler. We will give a prescription for generic airduo. Check blood allergy profile and PFTs. She would like to get more information about the co pay for these tests before deciding to proceed.  Plan/Recommendations: - Start airduo - Check A1AT levels, blood allergy profile, PFTs  Marshell Garfinkel MD [facility] Pager [phone] 07/25/2016, 11:09 AM  CC: Velna Hatchet, MD

## 2016-07-25 NOTE — Patient Instructions (Signed)
We will give a prescription for generic airduo  We will order CBC differential, blood allergy profile, alpha-1 antitrypsin levels and phenotype We'll schedule for pulmonary function tests Please check with your insurance company about the co-pay and if he wished to go ahead with these tests.  Return to clinic in 3 months.

## 2016-08-23 ENCOUNTER — Other Ambulatory Visit: Payer: Self-pay | Admitting: Obstetrics and Gynecology

## 2016-08-23 DIAGNOSIS — Z1231 Encounter for screening mammogram for malignant neoplasm of breast: Secondary | ICD-10-CM

## 2016-09-17 ENCOUNTER — Ambulatory Visit
Admission: RE | Admit: 2016-09-17 | Discharge: 2016-09-17 | Disposition: A | Discharge status: Home / Self Care | Payer: BLUE CROSS/BLUE SHIELD | Source: Ambulatory Visit | Attending: Obstetrics and Gynecology | Admitting: Obstetrics and Gynecology

## 2016-09-17 DIAGNOSIS — Z1231 Encounter for screening mammogram for malignant neoplasm of breast: Secondary | ICD-10-CM | POA: Diagnosis not present

## 2016-11-06 ENCOUNTER — Ambulatory Visit: Payer: BLUE CROSS/BLUE SHIELD | Admitting: Pulmonary Disease

## 2017-01-14 ENCOUNTER — Other Ambulatory Visit: Payer: Self-pay | Admitting: Obstetrics and Gynecology

## 2017-01-14 DIAGNOSIS — Z6825 Body mass index (BMI) 25.0-25.9, adult: Secondary | ICD-10-CM | POA: Diagnosis not present

## 2017-01-14 DIAGNOSIS — N63 Unspecified lump in unspecified breast: Secondary | ICD-10-CM

## 2017-01-14 DIAGNOSIS — N6321 Unspecified lump in the left breast, upper outer quadrant: Secondary | ICD-10-CM | POA: Diagnosis not present

## 2017-01-14 DIAGNOSIS — Z01419 Encounter for gynecological examination (general) (routine) without abnormal findings: Secondary | ICD-10-CM | POA: Diagnosis not present

## 2017-01-16 ENCOUNTER — Other Ambulatory Visit: Payer: Self-pay | Admitting: Obstetrics and Gynecology

## 2017-01-16 DIAGNOSIS — N63 Unspecified lump in unspecified breast: Secondary | ICD-10-CM

## 2017-01-24 DIAGNOSIS — Z0000 Encounter for general adult medical examination without abnormal findings: Secondary | ICD-10-CM | POA: Diagnosis not present

## 2017-01-24 DIAGNOSIS — E559 Vitamin D deficiency, unspecified: Secondary | ICD-10-CM | POA: Diagnosis not present

## 2017-01-24 DIAGNOSIS — R946 Abnormal results of thyroid function studies: Secondary | ICD-10-CM | POA: Diagnosis not present

## 2017-02-01 DIAGNOSIS — E559 Vitamin D deficiency, unspecified: Secondary | ICD-10-CM | POA: Diagnosis not present

## 2017-02-01 DIAGNOSIS — Z1389 Encounter for screening for other disorder: Secondary | ICD-10-CM | POA: Diagnosis not present

## 2017-02-01 DIAGNOSIS — E7849 Other hyperlipidemia: Secondary | ICD-10-CM | POA: Diagnosis not present

## 2017-02-01 DIAGNOSIS — J328 Other chronic sinusitis: Secondary | ICD-10-CM | POA: Diagnosis not present

## 2017-02-01 DIAGNOSIS — Z23 Encounter for immunization: Secondary | ICD-10-CM | POA: Diagnosis not present

## 2017-02-01 DIAGNOSIS — Z0000 Encounter for general adult medical examination without abnormal findings: Secondary | ICD-10-CM | POA: Diagnosis not present

## 2017-02-01 DIAGNOSIS — F330 Major depressive disorder, recurrent, mild: Secondary | ICD-10-CM | POA: Diagnosis not present

## 2017-03-01 DIAGNOSIS — M79602 Pain in left arm: Secondary | ICD-10-CM | POA: Diagnosis not present

## 2017-03-01 DIAGNOSIS — Z6824 Body mass index (BMI) 24.0-24.9, adult: Secondary | ICD-10-CM | POA: Diagnosis not present

## 2017-09-30 DIAGNOSIS — J029 Acute pharyngitis, unspecified: Secondary | ICD-10-CM | POA: Diagnosis not present

## 2017-09-30 DIAGNOSIS — J45901 Unspecified asthma with (acute) exacerbation: Secondary | ICD-10-CM | POA: Diagnosis not present

## 2017-09-30 DIAGNOSIS — R0602 Shortness of breath: Secondary | ICD-10-CM | POA: Diagnosis not present

## 2017-09-30 DIAGNOSIS — J069 Acute upper respiratory infection, unspecified: Secondary | ICD-10-CM | POA: Diagnosis not present

## 2017-10-11 ENCOUNTER — Other Ambulatory Visit: Payer: Self-pay | Admitting: Obstetrics and Gynecology

## 2017-10-11 DIAGNOSIS — Z1231 Encounter for screening mammogram for malignant neoplasm of breast: Secondary | ICD-10-CM

## 2017-10-13 DIAGNOSIS — J45909 Unspecified asthma, uncomplicated: Secondary | ICD-10-CM | POA: Diagnosis not present

## 2017-11-07 ENCOUNTER — Ambulatory Visit
Admission: RE | Admit: 2017-11-07 | Discharge: 2017-11-07 | Disposition: A | Discharge status: Home / Self Care | Payer: BLUE CROSS/BLUE SHIELD | Source: Ambulatory Visit | Attending: Obstetrics and Gynecology | Admitting: Obstetrics and Gynecology

## 2017-11-07 DIAGNOSIS — Z1231 Encounter for screening mammogram for malignant neoplasm of breast: Secondary | ICD-10-CM | POA: Diagnosis not present

## 2018-01-29 DIAGNOSIS — Z0000 Encounter for general adult medical examination without abnormal findings: Secondary | ICD-10-CM | POA: Diagnosis not present

## 2018-01-29 DIAGNOSIS — E7849 Other hyperlipidemia: Secondary | ICD-10-CM | POA: Diagnosis not present

## 2018-01-29 DIAGNOSIS — E559 Vitamin D deficiency, unspecified: Secondary | ICD-10-CM | POA: Diagnosis not present

## 2018-02-05 DIAGNOSIS — J45998 Other asthma: Secondary | ICD-10-CM | POA: Diagnosis not present

## 2018-02-05 DIAGNOSIS — Z23 Encounter for immunization: Secondary | ICD-10-CM | POA: Diagnosis not present

## 2018-02-05 DIAGNOSIS — E559 Vitamin D deficiency, unspecified: Secondary | ICD-10-CM | POA: Diagnosis not present

## 2018-02-05 DIAGNOSIS — Z0000 Encounter for general adult medical examination without abnormal findings: Secondary | ICD-10-CM | POA: Diagnosis not present

## 2018-02-05 DIAGNOSIS — Z1389 Encounter for screening for other disorder: Secondary | ICD-10-CM | POA: Diagnosis not present

## 2018-02-05 DIAGNOSIS — E65 Localized adiposity: Secondary | ICD-10-CM | POA: Diagnosis not present

## 2018-02-05 DIAGNOSIS — J439 Emphysema, unspecified: Secondary | ICD-10-CM | POA: Diagnosis not present

## 2018-02-05 DIAGNOSIS — E7849 Other hyperlipidemia: Secondary | ICD-10-CM | POA: Diagnosis not present

## 2018-07-28 DIAGNOSIS — Z20828 Contact with and (suspected) exposure to other viral communicable diseases: Secondary | ICD-10-CM | POA: Diagnosis not present

## 2018-08-28 DIAGNOSIS — Z20828 Contact with and (suspected) exposure to other viral communicable diseases: Secondary | ICD-10-CM | POA: Diagnosis not present

## 2018-09-20 DIAGNOSIS — J019 Acute sinusitis, unspecified: Secondary | ICD-10-CM | POA: Diagnosis not present

## 2018-09-22 ENCOUNTER — Other Ambulatory Visit: Payer: Self-pay | Admitting: Obstetrics and Gynecology

## 2018-09-22 DIAGNOSIS — Z1231 Encounter for screening mammogram for malignant neoplasm of breast: Secondary | ICD-10-CM

## 2018-11-10 ENCOUNTER — Ambulatory Visit
Admission: RE | Admit: 2018-11-10 | Discharge: 2018-11-10 | Disposition: A | Discharge status: Home / Self Care | Payer: BC Managed Care – PPO | Source: Ambulatory Visit | Attending: Obstetrics and Gynecology | Admitting: Obstetrics and Gynecology

## 2018-11-10 ENCOUNTER — Other Ambulatory Visit: Payer: Self-pay

## 2018-11-10 DIAGNOSIS — Z1231 Encounter for screening mammogram for malignant neoplasm of breast: Secondary | ICD-10-CM

## 2018-12-12 DIAGNOSIS — J329 Chronic sinusitis, unspecified: Secondary | ICD-10-CM | POA: Diagnosis not present

## 2018-12-12 DIAGNOSIS — R0982 Postnasal drip: Secondary | ICD-10-CM | POA: Diagnosis not present

## 2018-12-12 DIAGNOSIS — J029 Acute pharyngitis, unspecified: Secondary | ICD-10-CM | POA: Diagnosis not present

## 2019-01-08 IMAGING — CT CT ANGIO CHEST
2 of 6 series · 18 of 36 positions shown · IV contrast (isovue)
Comparison: None.

CLINICAL DATA: Acute onset of dyspnea. Question of abnormal chest
radiograph. Initial encounter.

EXAM:
CT ANGIOGRAPHY CHEST WITH CONTRAST
TECHNIQUE: Multidetector CT imaging of the chest was performed using the
standard protocol during bolus administration of intravenous
contrast. Multiplanar CT image reconstructions and MIPs were
obtained to evaluate the vascular anatomy.
CONTRAST:  58 mL of Isovue 370 IV contrast

[Series 7: pe thins · axial · 0.71mm/px · z∈[+1393,+1586]mm · 17 of 219 slices shown]
[im 13/219  lung]
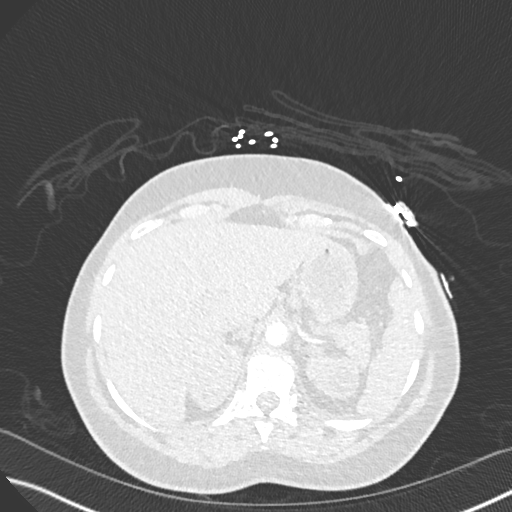
[im 25/219  mediastinal]
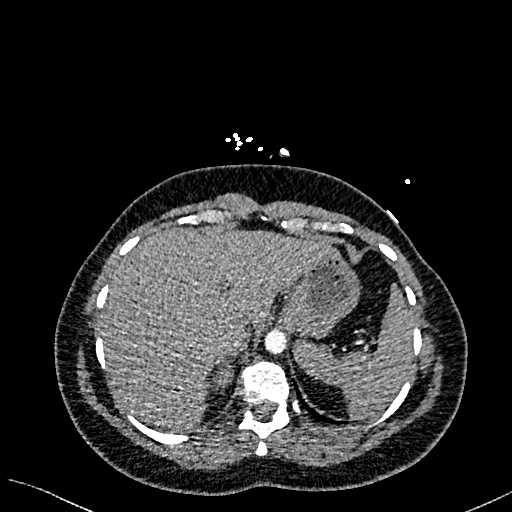
[im 37/219  lung]
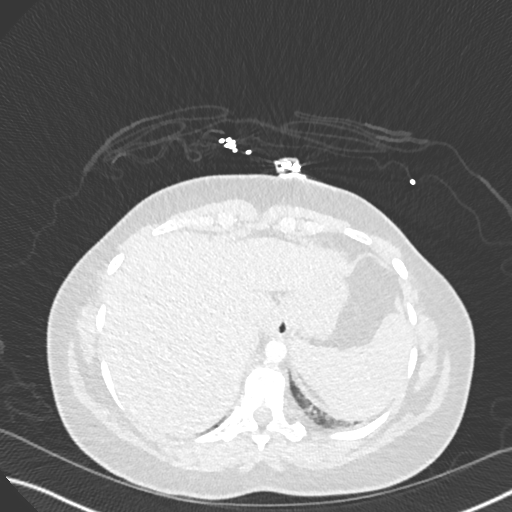
[im 49/219  mediastinal]
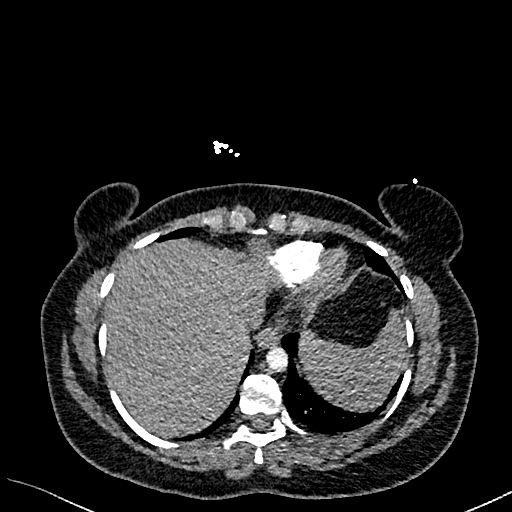
[im 61/219  lung]
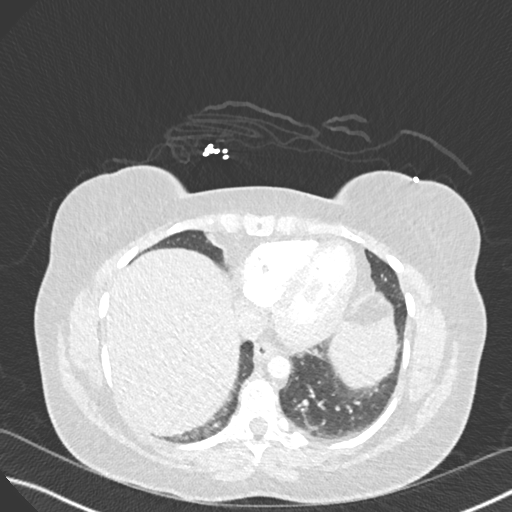
[im 73/219  mediastinal]
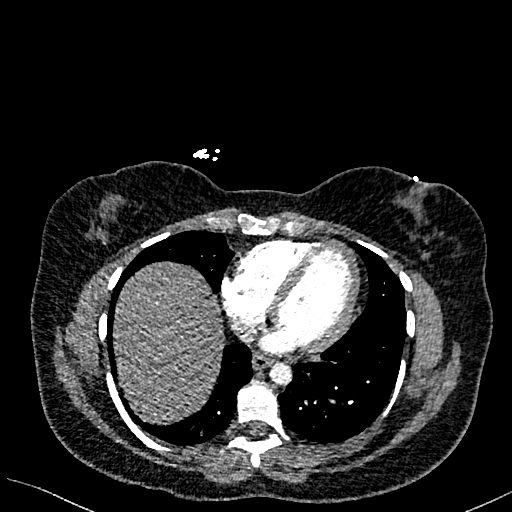
[im 85/219  lung]
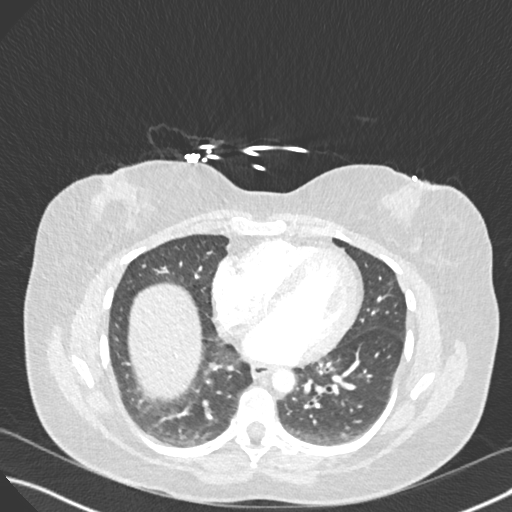
[im 97/219  mediastinal]
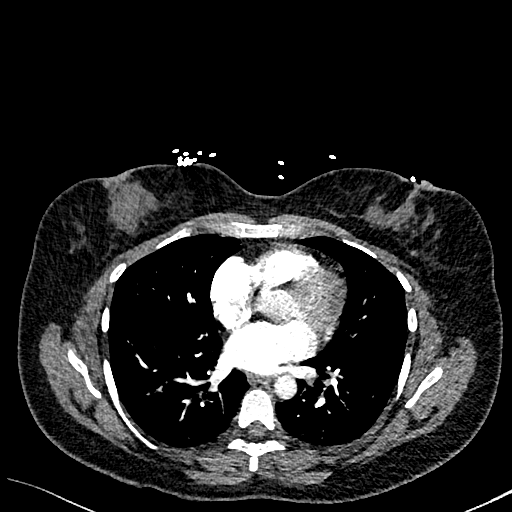
[im 110/219  lung]
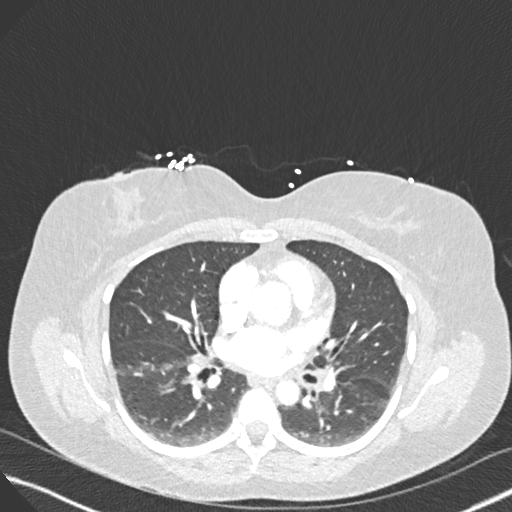
[im 122/219  mediastinal]
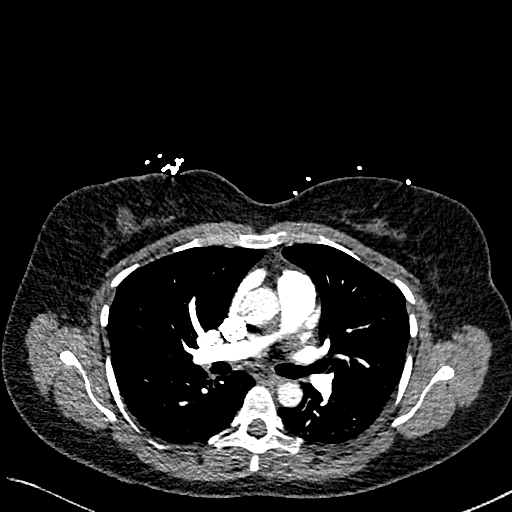
[im 134/219  lung]
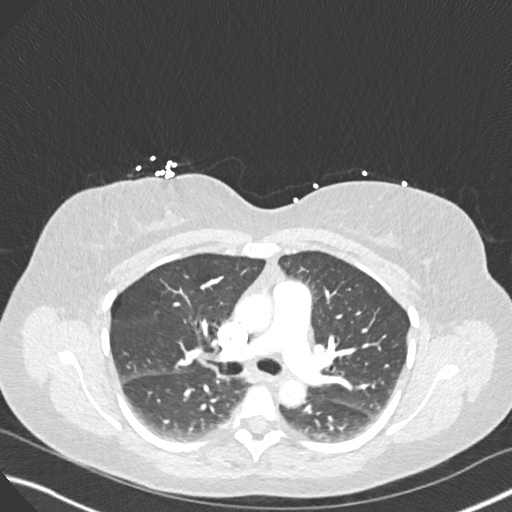
[im 146/219  mediastinal]
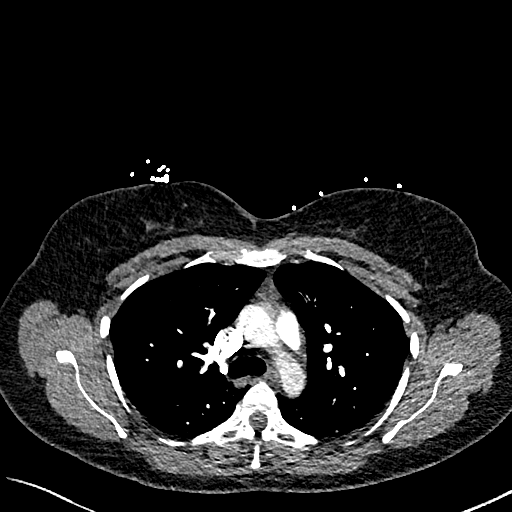
[im 158/219  lung]
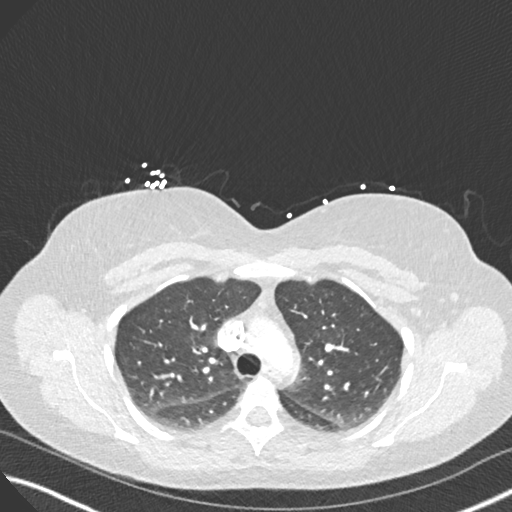
[im 170/219  mediastinal]
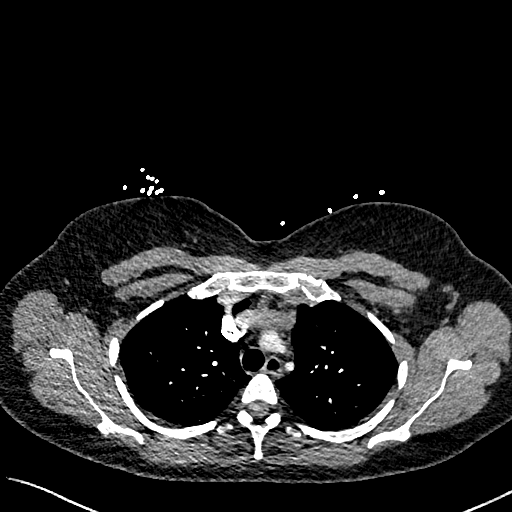
[im 182/219  lung]
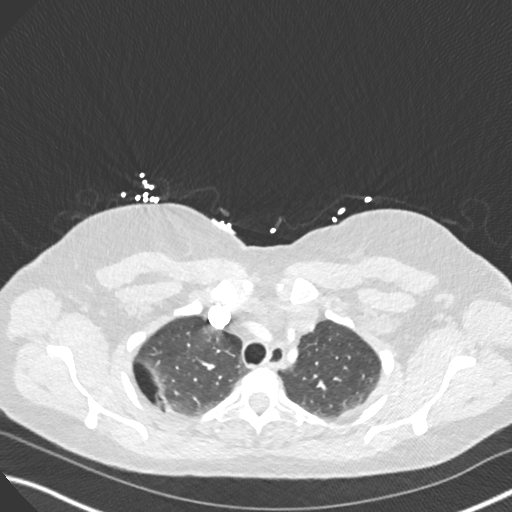
[im 194/219  mediastinal]
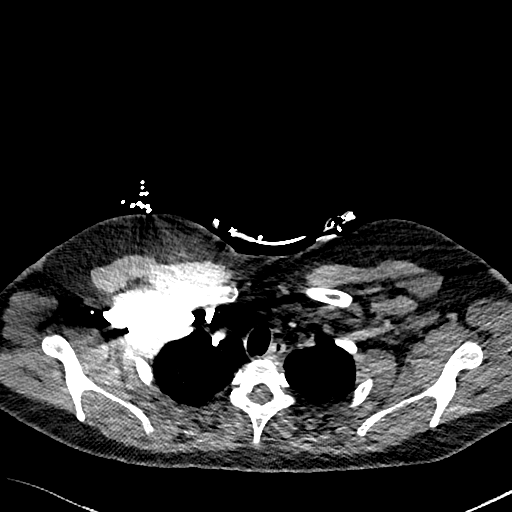
[im 206/219  lung]
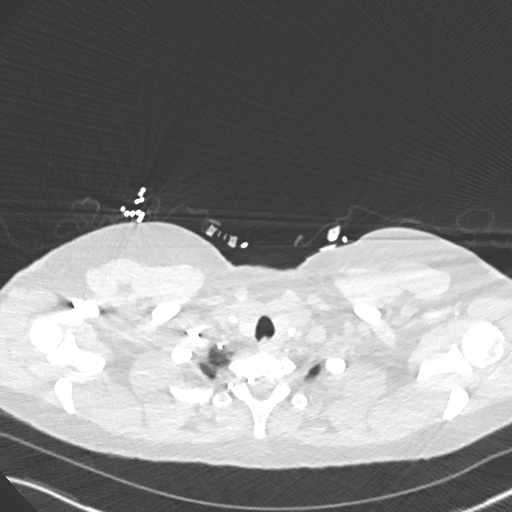

[Series 8: pe 2mm cor · coronal · 0.43mm/px · 1 of 118 slices shown]
[im 59/118  mediastinal]
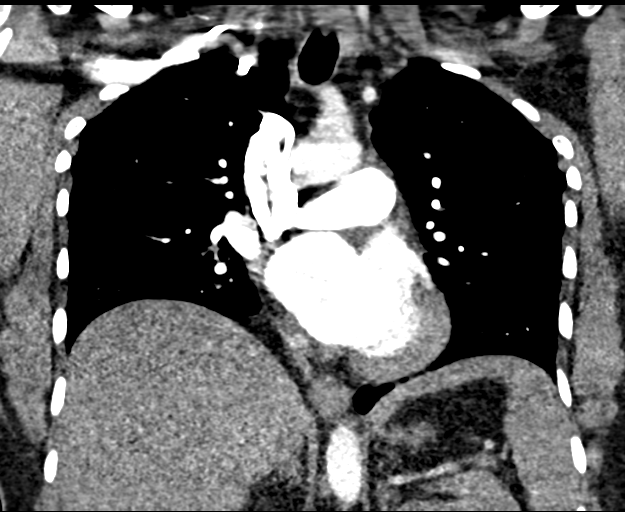

[18 of 36 positions shown; findings below may reference images not displayed]

FINDINGS: Cardiovascular:  There is no evidence of pulmonary embolus.

The heart is normal in size. The thoracic aorta is grossly
unremarkable. The great vessels are grossly unremarkable in
appearance.

Mediastinum/Nodes: The mediastinum is unremarkable in appearance. No
mediastinal lymphadenopathy is seen. No pericardial effusion is
identified. The visualized portions of the thyroid gland are
unremarkable. No axillary lymphadenopathy is appreciated.

Lungs/Pleura: Mild bibasilar atelectasis is noted. A large bleb is
noted at the right lung apex. No pleural effusion or pneumothorax is
seen. No masses are identified.

Upper Abdomen: The visualized portions of the liver and spleen are
grossly unremarkable. A left renal cyst is noted. The visualized
portions of the adrenal glands are grossly unremarkable.

Musculoskeletal: No acute osseous abnormalities are identified. The
visualized musculature is unremarkable in appearance.

Review of the MIP images confirms the above findings.
IMPRESSION: 1. No evidence of pulmonary embolus.
2. Mild bibasilar atelectasis noted. Large bleb at the right lung
apex.
3. Left renal cyst noted.

## 2019-02-23 DIAGNOSIS — E7849 Other hyperlipidemia: Secondary | ICD-10-CM | POA: Diagnosis not present

## 2019-02-23 DIAGNOSIS — R946 Abnormal results of thyroid function studies: Secondary | ICD-10-CM | POA: Diagnosis not present

## 2019-02-23 DIAGNOSIS — E559 Vitamin D deficiency, unspecified: Secondary | ICD-10-CM | POA: Diagnosis not present

## 2019-03-02 DIAGNOSIS — Z1331 Encounter for screening for depression: Secondary | ICD-10-CM | POA: Diagnosis not present

## 2019-03-02 DIAGNOSIS — F419 Anxiety disorder, unspecified: Secondary | ICD-10-CM | POA: Diagnosis not present

## 2019-03-02 DIAGNOSIS — J45909 Unspecified asthma, uncomplicated: Secondary | ICD-10-CM | POA: Diagnosis not present

## 2019-03-02 DIAGNOSIS — Z86010 Personal history of colonic polyps: Secondary | ICD-10-CM | POA: Diagnosis not present

## 2019-03-02 DIAGNOSIS — E559 Vitamin D deficiency, unspecified: Secondary | ICD-10-CM | POA: Diagnosis not present

## 2019-03-02 DIAGNOSIS — Z0000 Encounter for general adult medical examination without abnormal findings: Secondary | ICD-10-CM | POA: Diagnosis not present

## 2019-04-21 DIAGNOSIS — Z01419 Encounter for gynecological examination (general) (routine) without abnormal findings: Secondary | ICD-10-CM | POA: Diagnosis not present

## 2019-04-21 DIAGNOSIS — Z780 Asymptomatic menopausal state: Secondary | ICD-10-CM | POA: Diagnosis not present

## 2019-04-21 DIAGNOSIS — Z6824 Body mass index (BMI) 24.0-24.9, adult: Secondary | ICD-10-CM | POA: Diagnosis not present

## 2019-06-25 DIAGNOSIS — F330 Major depressive disorder, recurrent, mild: Secondary | ICD-10-CM | POA: Diagnosis not present

## 2019-06-25 DIAGNOSIS — L309 Dermatitis, unspecified: Secondary | ICD-10-CM | POA: Diagnosis not present

## 2019-06-25 DIAGNOSIS — F419 Anxiety disorder, unspecified: Secondary | ICD-10-CM | POA: Diagnosis not present

## 2019-07-15 DIAGNOSIS — F419 Anxiety disorder, unspecified: Secondary | ICD-10-CM | POA: Diagnosis not present

## 2019-07-15 DIAGNOSIS — J302 Other seasonal allergic rhinitis: Secondary | ICD-10-CM | POA: Diagnosis not present

## 2019-07-15 DIAGNOSIS — L309 Dermatitis, unspecified: Secondary | ICD-10-CM | POA: Diagnosis not present

## 2019-07-15 DIAGNOSIS — R0982 Postnasal drip: Secondary | ICD-10-CM | POA: Diagnosis not present

## 2019-11-20 ENCOUNTER — Other Ambulatory Visit: Payer: Self-pay | Admitting: Obstetrics and Gynecology

## 2019-11-20 DIAGNOSIS — Z1231 Encounter for screening mammogram for malignant neoplasm of breast: Secondary | ICD-10-CM

## 2019-12-09 ENCOUNTER — Ambulatory Visit: Payer: BC Managed Care – PPO

## 2020-01-08 ENCOUNTER — Other Ambulatory Visit: Payer: Self-pay

## 2020-01-08 ENCOUNTER — Ambulatory Visit
Admission: RE | Admit: 2020-01-08 | Discharge: 2020-01-08 | Disposition: A | Discharge status: Home / Self Care | Payer: BC Managed Care – PPO | Source: Ambulatory Visit | Attending: Obstetrics and Gynecology | Admitting: Obstetrics and Gynecology

## 2020-01-08 ENCOUNTER — Ambulatory Visit: Payer: Self-pay

## 2020-01-08 DIAGNOSIS — Z1231 Encounter for screening mammogram for malignant neoplasm of breast: Secondary | ICD-10-CM | POA: Diagnosis not present

## 2020-01-20 ENCOUNTER — Encounter (HOSPITAL_COMMUNITY): Payer: Self-pay

## 2020-01-20 ENCOUNTER — Emergency Department (HOSPITAL_COMMUNITY)
Admission: EM | Admit: 2020-01-20 | Discharge: 2020-01-21 | Disposition: A | Discharge status: Home / Self Care | Payer: BC Managed Care – PPO | Attending: Emergency Medicine | Admitting: Emergency Medicine

## 2020-01-20 DIAGNOSIS — N134 Hydroureter: Secondary | ICD-10-CM | POA: Diagnosis not present

## 2020-01-20 DIAGNOSIS — N390 Urinary tract infection, site not specified: Secondary | ICD-10-CM | POA: Insufficient documentation

## 2020-01-20 DIAGNOSIS — N281 Cyst of kidney, acquired: Secondary | ICD-10-CM | POA: Insufficient documentation

## 2020-01-20 DIAGNOSIS — R109 Unspecified abdominal pain: Secondary | ICD-10-CM | POA: Diagnosis not present

## 2020-01-20 DIAGNOSIS — Z7951 Long term (current) use of inhaled steroids: Secondary | ICD-10-CM | POA: Insufficient documentation

## 2020-01-20 DIAGNOSIS — N201 Calculus of ureter: Secondary | ICD-10-CM | POA: Diagnosis not present

## 2020-01-20 DIAGNOSIS — J45909 Unspecified asthma, uncomplicated: Secondary | ICD-10-CM | POA: Diagnosis not present

## 2020-01-20 DIAGNOSIS — N200 Calculus of kidney: Secondary | ICD-10-CM | POA: Diagnosis not present

## 2020-01-20 DIAGNOSIS — N2882 Megaloureter: Secondary | ICD-10-CM | POA: Diagnosis not present

## 2020-01-20 LAB — COMPREHENSIVE METABOLIC PANEL
ALT: 17 U/L (ref 0–44)
AST: 20 U/L (ref 15–41)
Albumin: 4.2 g/dL (ref 3.5–5.0)
Alkaline Phosphatase: 57 U/L (ref 38–126)
Anion gap: 10 (ref 5–15)
BUN: 14 mg/dL (ref 6–20)
CO2: 24 mmol/L (ref 22–32)
Calcium: 9.8 mg/dL (ref 8.9–10.3)
Chloride: 104 mmol/L (ref 98–111)
Creatinine, Ser: 0.99 mg/dL (ref 0.44–1.00)
GFR, Estimated: >60 mL/min (ref >60)
Glucose, Bld: 148 mg/dL — ABNORMAL HIGH (ref 70–99)
Potassium: 3.5 mmol/L (ref 3.5–5.1)
Sodium: 138 mmol/L (ref 135–145)
Total Bilirubin: 0.5 mg/dL (ref 0.3–1.2)
Total Protein: 7.4 g/dL (ref 6.5–8.1)

## 2020-01-20 LAB — URINALYSIS, ROUTINE W REFLEX MICROSCOPIC
Bilirubin Urine: NEGATIVE
Glucose, UA: NEGATIVE mg/dL
Ketones, ur: NEGATIVE mg/dL
Nitrite: NEGATIVE
Protein, ur: NEGATIVE mg/dL
Specific Gravity, Urine: 1.016 (ref 1.005–1.030)
pH: 5 (ref 5.0–8.0)

## 2020-01-20 LAB — CBC
HCT: 41.8 % (ref 36.0–46.0)
Hemoglobin: 13.5 g/dL (ref 12.0–15.0)
MCH: 27.3 pg (ref 26.0–34.0)
MCHC: 32.3 g/dL (ref 30.0–36.0)
MCV: 84.6 fL (ref 80.0–100.0)
Platelets: 207 10*3/uL (ref 150–400)
RBC: 4.94 MIL/uL (ref 3.87–5.11)
RDW: 13.1 % (ref 11.5–15.5)
WBC: 11.9 10*3/uL — ABNORMAL HIGH (ref 4.0–10.5)
nRBC: 0 % (ref 0.0–0.2)

## 2020-01-20 LAB — I-STAT BETA HCG BLOOD, ED (MC, WL, AP ONLY): I-stat hCG, quantitative: <5 m[IU]/mL (ref <5)

## 2020-01-20 LAB — LIPASE, BLOOD: Lipase: 31 U/L (ref 11–51)

## 2020-01-20 NOTE — ED Triage Notes (Signed)
Pt reports that she has been having lower abd pain, right sided today with some nausea, denies diarrhea., denies dysuria.

## 2020-01-21 ENCOUNTER — Other Ambulatory Visit: Payer: Self-pay

## 2020-01-21 ENCOUNTER — Emergency Department (HOSPITAL_COMMUNITY): Payer: BC Managed Care – PPO

## 2020-01-21 DIAGNOSIS — N281 Cyst of kidney, acquired: Secondary | ICD-10-CM | POA: Diagnosis not present

## 2020-01-21 DIAGNOSIS — N134 Hydroureter: Secondary | ICD-10-CM | POA: Diagnosis not present

## 2020-01-21 DIAGNOSIS — N200 Calculus of kidney: Secondary | ICD-10-CM | POA: Diagnosis not present

## 2020-01-21 DIAGNOSIS — N390 Urinary tract infection, site not specified: Secondary | ICD-10-CM | POA: Diagnosis not present

## 2020-01-21 DIAGNOSIS — N2882 Megaloureter: Secondary | ICD-10-CM | POA: Diagnosis not present

## 2020-01-21 MED ORDER — CEPHALEXIN 500 MG PO CAPS: 500.0000 mg | ORAL_CAPSULE | Freq: Two times a day (BID) | ORAL | 0 refills | Status: AC

## 2020-01-21 MED ORDER — IOHEXOL 300 MG/ML  SOLN
100.0000 mL | Freq: Once | INTRAMUSCULAR | Status: AC | PRN
  Administered 2020-01-21: 100 mL via INTRAVENOUS

## 2020-01-21 MED ORDER — HYDROCODONE-ACETAMINOPHEN 5-325 MG PO TABS: 1.0000 | ORAL_TABLET | ORAL | 0 refills | Status: DC | PRN

## 2020-01-21 NOTE — ED Notes (Signed)
Pt ambulated to bathroom without difficulty.

## 2020-01-21 NOTE — ED Provider Notes (Signed)
[facility] Provider Note   CSN: [id number] Arrival date & time: 01/20/20  2242     History Chief Complaint  Patient presents with  . Abdominal Pain    Kathryn Garcia is a 59 y.o. female.  59 year old female with history of asthma, depression, presents with complaint of abdominal pain, onset 8PM last night while at a hair salon. Patient reports generalized pain initially which became right side abdominal pain, lasted about 2 hours and has since improved. Reports feeling the urge to void at this time. Pain is worse with deep breathing, no pain with movement/walking. Denies nausea, vomiting, fevers/chills, loss of appetite, changes in bowel habits, dysuria. No other complaints or concerns. Prior abdominal surgeries include partial hysterectomy.         Past Medical History:  Diagnosis Date  . Anxiety   . Arthritis    knees  . Asthma   . Depression   . Personal history of colonic polyps 06/20/2015  . Seasonal allergies   . Vitamin D deficiency     Patient Active Problem List   Diagnosis Date Noted  . Asthma 07/25/2016  . Bleb, lung ([place]) 07/25/2016  . Personal history of colonic polyps 02/16/2012    Past Surgical History:  Procedure Laterality Date  . COLONOSCOPY    . PARTIAL HYSTERECTOMY       OB History   No obstetric history on file.     Family History  Problem Relation Age of Onset  . Diabetes Mother   . Arthritis Mother   . Arthritis Maternal Grandmother   . Colon cancer Neg Hx     Social History   Tobacco Use  . Smoking status: Never Smoker  . Smokeless tobacco: Never Used  Vaping Use  . Vaping Use: Never used  Substance Use Topics  . Alcohol use: Yes    Alcohol/week: 0.0 standard drinks    Comment: socially  . Drug use: No    Home Medications Prior to Admission medications   Medication Sig Start Date End Date Taking? Authorizing Provider  albuterol (PROVENTIL HFA;VENTOLIN HFA) 108 (90 Base) MCG/ACT  inhaler Inhale 1-2 puffs into the lungs every 6 (six) hours as needed for wheezing or shortness of breath. 06/26/16   Charlesetta Shanks, MD  benzonatate (TESSALON) 100 MG capsule Take by mouth 3 (three) times daily as needed for cough.    [provider]  cephALEXin (KEFLEX) 500 MG capsule Take 1 capsule (500 mg total) by mouth 2 (two) times daily for 5 days. 01/21/20 01/26/20  Tacy Learn, PA-C  Fluticasone-Salmeterol (AIRDUO RESPICLICK 151/76) 160-73 MCG/ACT AEPB Inhale 1 puff into the lungs 2 (two) times daily. 07/25/16   Mannam, Hart Robinsons, MD  HYDROcodone-acetaminophen (NORCO/VICODIN) 5-325 MG tablet Take 1 tablet by mouth every 4 (four) hours as needed. 01/21/20   Tacy Learn, PA-C  ipratropium-albuterol (DUONEB) 0.5-2.5 (3) MG/3ML SOLN Take 3 mLs by nebulization every 4 (four) hours as needed. 06/26/16   Charlesetta Shanks, MD  loratadine (CLARITIN) 10 MG tablet Take 10 mg by mouth daily.    [provider]  sertraline (ZOLOFT) 100 MG tablet Take 100 mg by mouth daily.  03/23/16   [provider]    Allergies    Patient has no known allergies.  Review of Systems   Review of Systems  Constitutional: Negative for appetite change, chills and fever.  Respiratory: Negative for shortness of breath.   Cardiovascular: Negative for chest pain.  Gastrointestinal: Positive for  abdominal pain. Negative for blood in stool, constipation, diarrhea, nausea and vomiting.  Genitourinary: Positive for urgency. Negative for dysuria.  Musculoskeletal: Negative for back pain.  Skin: Negative for rash and wound.  Allergic/Immunologic: Negative for immunocompromised state.  Neurological: Negative for weakness.  All other systems reviewed and are negative.   Physical Exam Updated Vital Signs BP (!) 137/95 (BP Location: Right Arm)   Pulse 72   Temp (!) 97.3 F (36.3 C)   Resp 17   SpO2 100%   Physical Exam Vitals and nursing note reviewed.  Constitutional:      General: She is not  in acute distress.    Appearance: She is well-developed. She is not diaphoretic.  HENT:     Head: Normocephalic and atraumatic.  Cardiovascular:     Rate and Rhythm: Normal rate and regular rhythm.     Heart sounds: Normal heart sounds.  Pulmonary:     Effort: Pulmonary effort is normal.     Breath sounds: Normal breath sounds.  Abdominal:     General: Bowel sounds are normal.     Palpations: Abdomen is soft.     Tenderness: There is no abdominal tenderness. There is no right CVA tenderness, left CVA tenderness or rebound. Negative signs include Rovsing's sign, psoas sign and obturator sign.  Skin:    General: Skin is warm and dry.     Findings: No erythema or rash.  Neurological:     Mental Status: She is alert and oriented to person, place, and time.  Psychiatric:        Behavior: Behavior normal.     ED Results / Procedures / Treatments   Labs (all labs ordered are listed, but only abnormal results are displayed) Labs Reviewed  COMPREHENSIVE METABOLIC PANEL - Abnormal; Notable for the following components:      Result Value   Glucose, Bld 148 (*)    All other components within normal limits  CBC - Abnormal; Notable for the following components:   WBC 11.9 (*)    All other components within normal limits  URINALYSIS, ROUTINE W REFLEX MICROSCOPIC - Abnormal; Notable for the following components:   APPearance HAZY (*)    Hgb urine dipstick LARGE (*)    Leukocytes,Ua MODERATE (*)    Bacteria, UA RARE (*)    All other components within normal limits  URINE CULTURE  LIPASE, BLOOD  I-STAT BETA HCG BLOOD, ED ([facility], [facility], [facility] ONLY)    EKG None  Radiology CT Abdomen Pelvis W Contrast  Result Date: 01/21/2020 CLINICAL DATA:  Right lower quadrant pain EXAM: CT ABDOMEN AND PELVIS WITH CONTRAST TECHNIQUE: Multidetector CT imaging of the abdomen and pelvis was performed using the standard protocol following bolus administration of intravenous contrast. CONTRAST:  170mL OMNIPAQUE  IOHEXOL 300 MG/ML  SOLN COMPARISON:  None. FINDINGS: Lower chest: No acute abnormality. Hepatobiliary: Small area of perifissural focal fat. Liver is otherwise unremarkable. Gallbladder is contracted. There is no biliary dilatation. Pancreas: Unremarkable. Spleen: Unremarkable. Adrenals/Urinary Tract: Adrenals are unremarkable. Bilateral renal cysts and too small to characterize hypoattenuating lesions. Mild fullness of the right renal collecting system. There is mild dilatation of the ureter. The distal portion of the ureter is poorly identified. There are several calcifications in this region including a possible 3 mm obstructing calculus. Partially distended bladder is unremarkable. Stomach/Bowel: Stomach is within normal limits. Bowel is normal in caliber. Normal appendix. Vascular/Lymphatic: Mild aortic atherosclerosis. No enlarged lymph nodes identified. Reproductive: Status post hysterectomy. No adnexal masses.  Other: No ascites.  No acute abnormality of the abdominal wall. Musculoskeletal: No acute osseous abnormality. IMPRESSION: Mild right hydroureter and right renal pelviectasis. Distal ureter is poorly identified with possible 3 mm obstructing calculus near the ureterovesical junction. There are also multiple phleboliths in this area. Is Mild aortic atherosclerosis. Electronically Signed   By: Macy Mis M.D.   On: 01/21/2020 09:29    Procedures Procedures (including critical care time)  Medications Ordered in ED Medications  iohexol (OMNIPAQUE) 300 MG/ML solution 100 mL (100 mLs Intravenous Contrast Given 01/21/20 8299)    ED Course  I have reviewed the triage vital signs and the nursing notes.  Pertinent labs & imaging results that were available during my care of the patient were reviewed by me and considered in my medical decision making (see chart for details).  Clinical Course as of Jan 21 956  Thu Jan 20, 4181  209 59 year old female with complaint of abdominal pain onset 12  hours ago.  At time of exam, abdomen is soft and nontender, patient reports urinary urgency.  Patient ambulatory to the bathroom without assistance, able to void, on repeat abdominal exam, continues to report right lower abdominal pain and pressure. Labs drawn 3 hours after onset of patient's pain with mild leukocytosis with white count of 11.9.  CMP without significant findings, urinalysis is hazy with large hemoglobin, moderate leukocytes with rare bacteria. Discussed with patient, possible urinary tract infection however no complaint of dysuria, with persistent right lower quadrant pain, plan is to evaluate for appendicitis with CT scan.   [LM]  [id number] CT with normal appendix, does show distal right 3 mm UVJ ureteral stone and incidental finding of renal cysts.  Discussed results with patient, pain remains controlled.  Plan is to discharge with Keflex, urine culture pending.  Given Norco to take if pain is not controlled with ibuprofen and recommend recheck with PCP for follow-up regarding renal cysts and today's visit.  Return precautions given.   [LM]    Clinical Course User Index [LM] Roque Lias   MDM Rules/Calculators/A&P                          Final Clinical Impression(s) / ED Diagnoses Final diagnoses:  Ureterolithiasis  Renal cyst  Urinary tract infection in female    Rx / DC Orders ED Discharge Orders         Ordered    cephALEXin (KEFLEX) 500 MG capsule  2 times daily        01/21/20 0953    HYDROcodone-acetaminophen (NORCO/VICODIN) 5-325 MG tablet  Every 4 hours PRN        01/21/20 0953           Tacy Learn, PA-C 01/21/20 0957    Gareth Morgan, MD 01/22/20 [phone]

## 2020-01-21 NOTE — Discharge Instructions (Signed)
Follow up with your doctor regarding today's visit. Incidental finding of renal cysts can be monitored by your PCP. Small right side kidney stone should pass, be sure to drink plenty of water. Take Norco for pain not controlled with Motrin. Return to the ER for worsening pain, fever, other concerning symptoms. Possible UTI, take Keflex as prescribed.

## 2020-01-26 DIAGNOSIS — N281 Cyst of kidney, acquired: Secondary | ICD-10-CM | POA: Diagnosis not present

## 2020-01-26 DIAGNOSIS — D72829 Elevated white blood cell count, unspecified: Secondary | ICD-10-CM | POA: Diagnosis not present

## 2020-02-09 DIAGNOSIS — Z20818 Contact with and (suspected) exposure to other bacterial communicable diseases: Secondary | ICD-10-CM | POA: Diagnosis not present

## 2020-03-02 DIAGNOSIS — E785 Hyperlipidemia, unspecified: Secondary | ICD-10-CM | POA: Diagnosis not present

## 2020-03-02 DIAGNOSIS — E559 Vitamin D deficiency, unspecified: Secondary | ICD-10-CM | POA: Diagnosis not present

## 2020-03-02 DIAGNOSIS — Z0000 Encounter for general adult medical examination without abnormal findings: Secondary | ICD-10-CM | POA: Diagnosis not present

## 2020-04-04 DIAGNOSIS — E559 Vitamin D deficiency, unspecified: Secondary | ICD-10-CM | POA: Diagnosis not present

## 2020-04-04 DIAGNOSIS — Z0000 Encounter for general adult medical examination without abnormal findings: Secondary | ICD-10-CM | POA: Diagnosis not present

## 2020-04-04 DIAGNOSIS — R82998 Other abnormal findings in urine: Secondary | ICD-10-CM | POA: Diagnosis not present

## 2020-04-05 ENCOUNTER — Other Ambulatory Visit: Payer: Self-pay | Admitting: Internal Medicine

## 2020-04-05 DIAGNOSIS — E785 Hyperlipidemia, unspecified: Secondary | ICD-10-CM

## 2020-04-06 DIAGNOSIS — Z87442 Personal history of urinary calculi: Secondary | ICD-10-CM | POA: Diagnosis not present

## 2020-04-06 DIAGNOSIS — N393 Stress incontinence (female) (male): Secondary | ICD-10-CM | POA: Diagnosis not present

## 2020-04-06 DIAGNOSIS — N281 Cyst of kidney, acquired: Secondary | ICD-10-CM | POA: Diagnosis not present

## 2020-04-08 DIAGNOSIS — N281 Cyst of kidney, acquired: Secondary | ICD-10-CM | POA: Diagnosis not present

## 2020-04-27 DIAGNOSIS — E785 Hyperlipidemia, unspecified: Secondary | ICD-10-CM | POA: Diagnosis not present

## 2020-04-27 DIAGNOSIS — R0789 Other chest pain: Secondary | ICD-10-CM | POA: Diagnosis not present

## 2020-05-06 ENCOUNTER — Ambulatory Visit
Admission: RE | Admit: 2020-05-06 | Discharge: 2020-05-06 | Disposition: A | Discharge status: Home / Self Care | Payer: No Typology Code available for payment source | Source: Ambulatory Visit | Attending: Internal Medicine | Admitting: Internal Medicine

## 2020-05-06 DIAGNOSIS — E785 Hyperlipidemia, unspecified: Secondary | ICD-10-CM | POA: Diagnosis not present

## 2020-08-26 DIAGNOSIS — Z01419 Encounter for gynecological examination (general) (routine) without abnormal findings: Secondary | ICD-10-CM | POA: Diagnosis not present

## 2020-08-26 DIAGNOSIS — Z6824 Body mass index (BMI) 24.0-24.9, adult: Secondary | ICD-10-CM | POA: Diagnosis not present

## 2020-10-20 DIAGNOSIS — R0602 Shortness of breath: Secondary | ICD-10-CM | POA: Diagnosis not present

## 2020-10-20 DIAGNOSIS — J40 Bronchitis, not specified as acute or chronic: Secondary | ICD-10-CM | POA: Diagnosis not present

## 2020-11-01 ENCOUNTER — Encounter: Payer: Self-pay | Admitting: Internal Medicine

## 2020-12-12 DIAGNOSIS — M2041 Other hammer toe(s) (acquired), right foot: Secondary | ICD-10-CM | POA: Diagnosis not present

## 2020-12-12 DIAGNOSIS — L84 Corns and callosities: Secondary | ICD-10-CM | POA: Diagnosis not present

## 2020-12-14 ENCOUNTER — Other Ambulatory Visit: Payer: Self-pay | Admitting: Internal Medicine

## 2020-12-14 DIAGNOSIS — Z1231 Encounter for screening mammogram for malignant neoplasm of breast: Secondary | ICD-10-CM

## 2021-01-16 ENCOUNTER — Encounter: Payer: Self-pay | Admitting: Internal Medicine

## 2021-03-09 ENCOUNTER — Ambulatory Visit: Payer: BC Managed Care – PPO

## 2021-03-15 ENCOUNTER — Encounter: Payer: BC Managed Care – PPO | Admitting: Internal Medicine

## 2021-03-31 DIAGNOSIS — R7309 Other abnormal glucose: Secondary | ICD-10-CM | POA: Diagnosis not present

## 2021-03-31 DIAGNOSIS — R946 Abnormal results of thyroid function studies: Secondary | ICD-10-CM | POA: Diagnosis not present

## 2021-03-31 DIAGNOSIS — E785 Hyperlipidemia, unspecified: Secondary | ICD-10-CM | POA: Diagnosis not present

## 2021-03-31 DIAGNOSIS — E559 Vitamin D deficiency, unspecified: Secondary | ICD-10-CM | POA: Diagnosis not present

## 2021-04-07 ENCOUNTER — Other Ambulatory Visit: Payer: Self-pay | Admitting: Obstetrics and Gynecology

## 2021-04-07 DIAGNOSIS — Z0000 Encounter for general adult medical examination without abnormal findings: Secondary | ICD-10-CM | POA: Diagnosis not present

## 2021-04-07 DIAGNOSIS — R030 Elevated blood-pressure reading, without diagnosis of hypertension: Secondary | ICD-10-CM | POA: Diagnosis not present

## 2021-04-07 DIAGNOSIS — Z1331 Encounter for screening for depression: Secondary | ICD-10-CM | POA: Diagnosis not present

## 2021-04-07 DIAGNOSIS — Z1231 Encounter for screening mammogram for malignant neoplasm of breast: Secondary | ICD-10-CM

## 2021-04-07 DIAGNOSIS — Z1339 Encounter for screening examination for other mental health and behavioral disorders: Secondary | ICD-10-CM | POA: Diagnosis not present

## 2021-05-19 ENCOUNTER — Ambulatory Visit (AMBULATORY_SURGERY_CENTER): Payer: BC Managed Care – PPO | Admitting: *Deleted

## 2021-05-19 VITALS — Ht 62.0 in | Wt 129.0 lb

## 2021-05-19 DIAGNOSIS — Z86010 Personal history of colonic polyps: Secondary | ICD-10-CM

## 2021-05-19 NOTE — Progress Notes (Signed)
No egg or soy allergy known to patient  ?No issues known to pt with past sedation with any surgeries or procedures ?Patient denies ever being told they had issues or difficulty with intubation  ?No FH of Malignant Hyperthermia ?Pt is not on diet pills ?Pt is not on  home 02  ?Pt is not on blood thinners  ?Pt denies issues with constipation  ?No A fib or A flutter ? ?Due to the COVID-19 pandemic we are asking patients to follow certain guidelines in PV and the [place]   ?Pt aware of COVID protocols and [facility] guidelines  ? ?PV completed over the phone. Pt verified name, DOB, address and insurance during PV today.  ?Pt emailed instruction packet ?Pt encouraged to call with questions or issues.  ?If pt has My chart, procedure instructions sent via My Chart  ? ?

## 2021-05-23 ENCOUNTER — Encounter: Payer: Self-pay | Admitting: Internal Medicine

## 2021-05-26 ENCOUNTER — Ambulatory Visit: Payer: BC Managed Care – PPO

## 2021-05-26 ENCOUNTER — Ambulatory Visit
Admission: RE | Admit: 2021-05-26 | Discharge: 2021-05-26 | Disposition: A | Discharge status: Home / Self Care | Payer: BC Managed Care – PPO | Source: Ambulatory Visit | Attending: Obstetrics and Gynecology | Admitting: Obstetrics and Gynecology

## 2021-05-26 DIAGNOSIS — Z1231 Encounter for screening mammogram for malignant neoplasm of breast: Secondary | ICD-10-CM

## 2021-05-28 ENCOUNTER — Encounter: Payer: Self-pay | Admitting: Certified Registered Nurse Anesthetist

## 2021-06-01 ENCOUNTER — Telehealth: Payer: Self-pay | Admitting: Internal Medicine

## 2021-06-01 NOTE — Telephone Encounter (Addendum)
Pt scheduled for colonoscopy tomorrow with Dr. Carlean Purl. She wanted to know if she could eat sherbert. Advised pt that she could not, and that she can only have what was on her instructions. Pt stated that she had 3 orange slices this morning. Advised pt not to have any more solid food today. Went over clear liquid diet with patient. Pt verbalized understanding and had no further concerns.  ?

## 2021-06-01 NOTE — Telephone Encounter (Signed)
Inbound call from patient stating that she had questions about foods that she could eat, patient is scheduled for her procedure with Dr. Carlean Purl tomorrow. Please advise.  ? ?[phone] ?

## 2021-06-02 ENCOUNTER — Ambulatory Visit (AMBULATORY_SURGERY_CENTER): Payer: BC Managed Care – PPO | Admitting: Internal Medicine

## 2021-06-02 ENCOUNTER — Encounter: Payer: Self-pay | Admitting: Internal Medicine

## 2021-06-02 VITALS — BP 136/83 | HR 66 | Temp 98.0°F | Resp 20 | Ht 62.0 in | Wt 129.0 lb

## 2021-06-02 DIAGNOSIS — Z1211 Encounter for screening for malignant neoplasm of colon: Secondary | ICD-10-CM | POA: Diagnosis not present

## 2021-06-02 DIAGNOSIS — K621 Rectal polyp: Secondary | ICD-10-CM

## 2021-06-02 DIAGNOSIS — D124 Benign neoplasm of descending colon: Secondary | ICD-10-CM

## 2021-06-02 DIAGNOSIS — K635 Polyp of colon: Secondary | ICD-10-CM

## 2021-06-02 DIAGNOSIS — Z86010 Personal history of colonic polyps: Secondary | ICD-10-CM | POA: Diagnosis not present

## 2021-06-02 DIAGNOSIS — D128 Benign neoplasm of rectum: Secondary | ICD-10-CM

## 2021-06-02 MED ORDER — SODIUM CHLORIDE 0.9 % IV SOLN: 500.0000 mL | Freq: Once | INTRAVENOUS | Status: DC

## 2021-06-02 NOTE — Progress Notes (Signed)
[facility] History and Physical ? ? ?Primary Care Physician:  Velna Hatchet, MD ? ? ?Reason for Procedure:   Hx polyps ? ?Plan:    colonoscopy ? ? ? ? ?HPI: Kathryn Garcia is a 61 y.o. female w/ Hx colon polyps as below ?01/2012 12 mm rectal adenoma - [place] ?06/20/2015 3 diminutive polyps 1 adenoma other 2 were mucosal polyps  ? ?Past Medical History:  ?Diagnosis Date  ? Allergy   ? Anxiety   ? Arthritis   ? knees  ? Asthma   ? Depression   ? Hyperlipidemia   ? Personal history of colonic polyps 06/20/2015  ? Prediabetes   ? no meds  ? Seasonal allergies   ? Vitamin D deficiency   ? improved  ? ? ?Past Surgical History:  ?Procedure Laterality Date  ? CHEST TUBE INSERTION    ? age 11-18  due to air in lungs  ? COLONOSCOPY    ? NSVD    ? x1  ? PARTIAL HYSTERECTOMY    ? POLYPECTOMY    ? ? ?Prior to Admission medications   ?Medication Sig Start Date End Date Taking? Authorizing Provider  ?albuterol (PROVENTIL HFA;VENTOLIN HFA) 108 (90 Base) MCG/ACT inhaler Inhale 1-2 puffs into the lungs every 6 (six) hours as needed for wheezing or shortness of breath. 06/26/16  Yes Charlesetta Shanks, MD  ?ALPRAZolam Duanne Moron) 0.5 MG tablet Take 0.5 mg by mouth 2 (two) times daily as needed. 05/18/21  Yes [provider]  ?Ascorbic Acid (VITAMIN C) 500 MG CHEW Take as directed-patient wants Petra Kuba Made brand 04/23/17  Yes [provider]  ?Cholecalciferol (QC VITAMIN D3) 50 MCG (2000 UT) CAPS Take 1 capsule by mouth daily. 02/05/18  Yes [provider]  ?Multiple Vitamin (MULTIVITAMIN) tablet Take 1 tablet by mouth daily.   Yes [provider]  ?rosuvastatin (CRESTOR) 5 MG tablet Take 5 mg by mouth daily. 04/07/21  Yes [provider]  ?sertraline (ZOLOFT) 100 MG tablet take 1 and 1/2 tablet 02/11/18  Yes [provider]  ?fluticasone (FLONASE) 50 MCG/ACT nasal spray Place into both nostrils. 05/18/21   [provider]  ?fluticasone furoate-vilanterol (BREO ELLIPTA)  100-25 MCG/ACT AEPB 1 puff ?Patient not taking: Reported on 06/02/2021 10/20/20   [provider]  ?Fluticasone-Salmeterol (AIRDUO RESPICLICK 706/23) 762-83 MCG/ACT AEPB Inhale 1 puff into the lungs 2 (two) times daily. ?Patient not taking: Reported on 05/19/2021 07/25/16   Marshell Garfinkel, MD  ?ipratropium-albuterol (DUONEB) 0.5-2.5 (3) MG/3ML SOLN Take 3 mLs by nebulization every 4 (four) hours as needed. 06/26/16   Charlesetta Shanks, MD  ? ? ?Current Outpatient Medications  ?Medication Sig Dispense Refill  ? albuterol (PROVENTIL HFA;VENTOLIN HFA) 108 (90 Base) MCG/ACT inhaler Inhale 1-2 puffs into the lungs every 6 (six) hours as needed for wheezing or shortness of breath. 1 Inhaler 0  ? ALPRAZolam (XANAX) 0.5 MG tablet Take 0.5 mg by mouth 2 (two) times daily as needed.    ? Ascorbic Acid (VITAMIN C) 500 MG CHEW Take as directed-patient wants Petra Kuba Made brand    ? Cholecalciferol (QC VITAMIN D3) 50 MCG (2000 UT) CAPS Take 1 capsule by mouth daily.    ? Multiple Vitamin (MULTIVITAMIN) tablet Take 1 tablet by mouth daily.    ? rosuvastatin (CRESTOR) 5 MG tablet Take 5 mg by mouth daily.    ? sertraline (ZOLOFT) 100 MG tablet take 1 and 1/2 tablet    ? fluticasone (FLONASE) 50 MCG/ACT nasal spray Place into both  nostrils.    ? fluticasone furoate-vilanterol (BREO ELLIPTA) 100-25 MCG/ACT AEPB 1 puff (Patient not taking: Reported on 06/02/2021)    ? Fluticasone-Salmeterol (AIRDUO RESPICLICK 031/59) 458-59 MCG/ACT AEPB Inhale 1 puff into the lungs 2 (two) times daily. (Patient not taking: Reported on 05/19/2021) 1 each 5  ? ipratropium-albuterol (DUONEB) 0.5-2.5 (3) MG/3ML SOLN Take 3 mLs by nebulization every 4 (four) hours as needed. 360 mL 1  ? ?Current Facility-Administered Medications  ?Medication Dose Route Frequency Provider Last Rate Last Admin  ? 0.9 %  sodium chloride infusion  500 mL Intravenous Once Gatha Mayer, MD      ? ? ?Allergies as of 06/02/2021 - Review Complete 06/02/2021  ?Allergen Reaction  Noted  ? Chocolate Other (See Comments) 04/07/2021  ? Green dyes Other (See Comments) 04/07/2021  ? ? ?Family History  ?Problem Relation Age of Onset  ? Diabetes Mother   ? Arthritis Mother   ? Arthritis Maternal Grandmother   ? Colon cancer Neg Hx   ? Colon polyps Neg Hx   ? Esophageal cancer Neg Hx   ? Rectal cancer Neg Hx   ? Stomach cancer Neg Hx   ? ? ?Social History  ? ?Socioeconomic History  ? Marital status: Divorced  ?  Spouse name: Not on file  ? Number of children: Not on file  ? Years of education: Not on file  ? Highest education level: Not on file  ?Occupational History  ? Not on file  ?Tobacco Use  ? Smoking status: Never  ? Smokeless tobacco: Never  ?Vaping Use  ? Vaping Use: Never used  ?Substance and Sexual Activity  ? Alcohol use: Yes  ?  Comment: socially- rare  ? Drug use: No  ? Sexual activity: Not on file  ?Other Topics Concern  ? Not on file  ?Social History Narrative  ? Not on file  ? ?Social Determinants of Health  ? ?Financial Resource Strain: Not on file  ?Food Insecurity: Not on file  ?Transportation Needs: Not on file  ?Physical Activity: Not on file  ?Stress: Not on file  ?Social Connections: Not on file  ?Intimate Partner Violence: Not on file  ? ? ?Review of Systems: ? ?All other review of systems negative except as mentioned in the HPI. ? ?Physical Exam: ?Vital signs ?BP (!) 144/82   Pulse 86   Temp 98 ?F (36.7 ?C) (Temporal)   Ht '5\' 2"'$  (1.575 m)   Wt 129 lb (58.5 kg)   SpO2 97%   BMI 23.59 kg/m?  ? ?General:   Alert,  Well-developed, well-nourished, pleasant and cooperative in NAD ?Lungs:  Clear throughout to auscultation.   ?Heart:  Regular rate and rhythm; no murmurs, clicks, rubs,  or gallops. ?Abdomen:  Soft, nontender and nondistended. Normal bowel sounds.   ?Neuro/Psych:  Alert and cooperative. Normal mood and affect. A and O x 3 ? ? ?'@Cerys Winget'$  Simonne Maffucci, MD, [facility] ?[facility] ?[phone] (pager) ?06/02/2021 10:25 AM@ ? ?

## 2021-06-02 NOTE — Patient Instructions (Addendum)
Please read handouts provided. ?Continue present medications. ?Await pathology results. ? ?YOU HAD AN ENDOSCOPIC PROCEDURE TODAY AT [facility]:   Refer to the procedure report that was given to you for any specific questions about what was found during the examination.  If the procedure report does not answer your questions, please call your gastroenterologist to clarify.  If you requested that your care partner not be given the details of your procedure findings, then the procedure report has been included in a sealed envelope for you to review at your convenience later. ? ?YOU SHOULD EXPECT: Some feelings of bloating in the abdomen. Passage of more gas than usual.  Walking can help get rid of the air that was put into your GI tract during the procedure and reduce the bloating. If you had a lower endoscopy (such as a colonoscopy or flexible sigmoidoscopy) you may notice spotting of blood in your stool or on the toilet paper. If you underwent a bowel prep for your procedure, you may not have a normal bowel movement for a few days. ? ?Please Note:  You might notice some irritation and congestion in your nose or some drainage.  This is from the oxygen used during your procedure.  There is no need for concern and it should clear up in a day or so. ? ?SYMPTOMS TO REPORT IMMEDIATELY: ? ?Following lower endoscopy (colonoscopy or flexible sigmoidoscopy): ? Excessive amounts of blood in the stool ? Significant tenderness or worsening of abdominal pains ? Swelling of the abdomen that is new, acute ? Fever of 100?F or higher ? ? ?For urgent or emergent issues, a gastroenterologist can be reached at any hour by calling [phone]. ?Do not use MyChart messaging for urgent concerns.  ? ? ?DIET:  We do recommend a small meal at first, but then you may proceed to your regular diet.  Drink plenty of fluids but you should avoid alcoholic beverages for 24 hours. ? ?ACTIVITY:  You should plan to take it easy for  the rest of today and you should NOT DRIVE or use heavy machinery until tomorrow (because of the sedation medicines used during the test).   ? ?FOLLOW UP: ?Our staff will call the number listed on your records 48-72 hours following your procedure to check on you and address any questions or concerns that you may have regarding the information given to you following your procedure. If we do not reach you, we will leave a message.  We will attempt to reach you two times.  During this call, we will ask if you have developed any symptoms of COVID 19. If you develop any symptoms (ie: fever, flu-like symptoms, shortness of breath, cough etc.) before then, please call [phone].  If you test positive for Covid 19 in the 2 weeks post procedure, please call and report this information to [place].   ? ?If any biopsies were taken you will be contacted by phone or by letter within the next 1-3 weeks.  Please call us at [phone] if you have not heard about the biopsies in 3 weeks.  ? ? ?SIGNATURES/CONFIDENTIALITY: ?You and/or your care partner have signed paperwork which will be entered into your electronic medical record.  These signatures attest to the fact that that the information above on your After Visit Summary has been reviewed and is understood.  Full responsibility of the confidentiality of this discharge information lies with you and/or your care-partner. I found and removed 3 tiny polyps today. ?  I will let you know pathology results and when to have another routine colonoscopy by mail and/or My Chart. ? ?I appreciate the opportunity to care for you. ?Gatha Mayer, MD, [facility] ? ?

## 2021-06-02 NOTE — Op Note (Signed)
[facility] ?Patient Name: Kathryn Garcia ?Procedure Date: 06/02/2021 10:15 AM ?MRN: [id number] ?Endoscopist: Gatha Mayer , MD ?Age: 61 ?Referring MD:  ?Date of Birth: 08/03/1960 ?Gender: Female ?Account #: [id number] ?Procedure:                Colonoscopy ?Indications:              Surveillance: Personal history of adenomatous  ?                          polyps on last colonoscopy > 5 years ago, Last  ?                          colonoscopy: 2017 ?Medicines:                Monitored Anesthesia Care ?Procedure:                Pre-Anesthesia Assessment: ?                          - Prior to the procedure, a History and Physical  ?                          was performed, and patient medications and  ?                          allergies were reviewed. The patient's tolerance of  ?                          previous anesthesia was also reviewed. The risks  ?                          and benefits of the procedure and the sedation  ?                          options and risks were discussed with the patient.  ?                          All questions were answered, and informed consent  ?                          was obtained. Prior Anticoagulants: The patient has  ?                          taken no previous anticoagulant or antiplatelet  ?                          agents. ASA Grade Assessment: II - A patient with  ?                          mild systemic disease. After reviewing the risks  ?                          and benefits, the patient was deemed in  ?  satisfactory condition to undergo the procedure. ?                          After obtaining informed consent, the colonoscope  ?                          was passed under direct vision. Throughout the  ?                          procedure, the patient's blood pressure, pulse, and  ?                          oxygen saturations were monitored continuously. The  ?                          Olympus PCF-H190DL ([id number]) Colonoscope  was  ?                          introduced through the anus and advanced to the the  ?                          cecum, identified by appendiceal orifice and  ?                          ileocecal valve. The colonoscopy was performed  ?                          without difficulty. The patient tolerated the  ?                          procedure well. The quality of the bowel  ?                          preparation was good. The bowel preparation used  ?                          was Miralax via split dose instruction. The  ?                          ileocecal valve, appendiceal orifice, and rectum  ?                          were photographed. ?Scope In: 10:35:13 AM ?Scope Out: 10:49:00 AM ?Scope Withdrawal Time: 0 hours 10 minutes 46 seconds  ?Total Procedure Duration: 0 hours 13 minutes 47 seconds  ?Findings:                 The perianal and digital rectal examinations were  ?                          normal. ?                          Three sessile polyps were found in the rectum and  ?  descending colon. The polyps were diminutive in  ?                          size. These polyps were removed with a cold snare.  ?                          Resection and retrieval were complete. Verification  ?                          of patient identification for the specimen was  ?                          done. Estimated blood loss was minimal. ?                          Scattered small-mouthed diverticula were found in  ?                          the sigmoid colon and descending colon. ?                          The exam was otherwise without abnormality on  ?                          direct and retroflexion views. ?Complications:            No immediate complications. ?Estimated Blood Loss:     Estimated blood loss was minimal. ?Impression:               - Three diminutive polyps in the rectum and in the  ?                          descending colon, removed with a cold snare.  ?                           Resected and retrieved. ?                          - Diverticulosis in the sigmoid colon and in the  ?                          descending colon. ?                          - The examination was otherwise normal on direct  ?                          and retroflexion views. ?                          - Personal history of colonic polyps. ?                          01/2012 12 mm rectal adenoma ?  06/20/2015 3 diminutive polyps 1 adenoma ?Recommendation:           - Patient has a contact number available for  ?                          emergencies. The signs and symptoms of potential  ?                          delayed complications were discussed with the  ?                          patient. Return to normal activities tomorrow.  ?                          Written discharge instructions were provided to the  ?                          patient. ?                          - Resume previous diet. ?                          - Continue present medications. ?                          - Await pathology results. ?                          - Repeat colonoscopy is recommended for  ?                          surveillance. The colonoscopy date will be  ?                          determined after pathology results from today's  ?                          exam become available for review. ?Gatha Mayer, MD ?06/02/2021 10:59:08 AM ?This report has been signed electronically. ?

## 2021-06-02 NOTE — Progress Notes (Signed)
Called to room to assist during endoscopic procedure.  Patient ID and intended procedure confirmed with present staff. Received instructions for my participation in the procedure from the performing physician.  

## 2021-06-02 NOTE — Progress Notes (Signed)
VS completed by CW.   Pt's states no medical or surgical changes since previsit or office visit.  

## 2021-06-02 NOTE — Progress Notes (Signed)
Report given to PACU, vss 

## 2021-06-06 ENCOUNTER — Encounter: Payer: Self-pay | Admitting: Internal Medicine

## 2021-06-06 ENCOUNTER — Telehealth: Payer: Self-pay | Admitting: *Deleted

## 2021-06-06 NOTE — Telephone Encounter (Signed)
?  Follow up Call- ? ? ?  06/02/2021  ? 10:07 AM  ?Call back number  ?Post procedure Call Back phone  # [phone]  ?Permission to leave phone message Yes  ?  ? ?Patient questions: ? ?Do you have a fever, pain , or abdominal swelling? No. ?Pain Score  0 * ? ?Have you tolerated food without any problems? Yes.   ? ?Have you been able to return to your normal activities? Yes.   ? ?Do you have any questions about your discharge instructions: ?Diet   No. ?Medications  No. ?Follow up visit  No. ? ?Do you have questions or concerns about your Care? No. ? ?Actions: ?* If pain score is 4 or above: ?No action needed, pain <4. ? ? ?

## 2021-06-13 ENCOUNTER — Telehealth: Payer: Self-pay

## 2021-06-13 NOTE — Telephone Encounter (Signed)
Pt called in stating that she has concerns about the recommendations to repeat the colonoscopy in 7 years: Pt was notified that this recommendation was made off of the pathology  and also evidenced based research: Pt was notified that if she has any change in her bowel patterns or GI related issues to call our office:  ?Pt verbalized understanding with all questions answered.  ?Pt requested that Dr. Carlean Purl be made aware of her concerns.  ?

## 2021-06-13 NOTE — Telephone Encounter (Signed)
Noted.  I think she received an appropriate explanation. ?

## 2022-02-14 DIAGNOSIS — M65341 Trigger finger, right ring finger: Secondary | ICD-10-CM | POA: Diagnosis not present

## 2022-03-28 DIAGNOSIS — Z01419 Encounter for gynecological examination (general) (routine) without abnormal findings: Secondary | ICD-10-CM | POA: Diagnosis not present

## 2022-03-28 DIAGNOSIS — Z1151 Encounter for screening for human papillomavirus (HPV): Secondary | ICD-10-CM | POA: Diagnosis not present

## 2022-03-28 DIAGNOSIS — Z1272 Encounter for screening for malignant neoplasm of vagina: Secondary | ICD-10-CM | POA: Diagnosis not present

## 2022-03-28 DIAGNOSIS — N9089 Other specified noninflammatory disorders of vulva and perineum: Secondary | ICD-10-CM | POA: Diagnosis not present

## 2022-03-28 DIAGNOSIS — Z6823 Body mass index (BMI) 23.0-23.9, adult: Secondary | ICD-10-CM | POA: Diagnosis not present

## 2022-04-11 DIAGNOSIS — F419 Anxiety disorder, unspecified: Secondary | ICD-10-CM | POA: Diagnosis not present

## 2022-04-11 DIAGNOSIS — K219 Gastro-esophageal reflux disease without esophagitis: Secondary | ICD-10-CM | POA: Diagnosis not present

## 2022-04-11 DIAGNOSIS — R7989 Other specified abnormal findings of blood chemistry: Secondary | ICD-10-CM | POA: Diagnosis not present

## 2022-04-11 DIAGNOSIS — E559 Vitamin D deficiency, unspecified: Secondary | ICD-10-CM | POA: Diagnosis not present

## 2022-04-11 DIAGNOSIS — E785 Hyperlipidemia, unspecified: Secondary | ICD-10-CM | POA: Diagnosis not present

## 2022-04-11 DIAGNOSIS — R7309 Other abnormal glucose: Secondary | ICD-10-CM | POA: Diagnosis not present

## 2022-04-18 DIAGNOSIS — R030 Elevated blood-pressure reading, without diagnosis of hypertension: Secondary | ICD-10-CM | POA: Diagnosis not present

## 2022-04-18 DIAGNOSIS — Z1339 Encounter for screening examination for other mental health and behavioral disorders: Secondary | ICD-10-CM | POA: Diagnosis not present

## 2022-04-18 DIAGNOSIS — Z1331 Encounter for screening for depression: Secondary | ICD-10-CM | POA: Diagnosis not present

## 2022-04-18 DIAGNOSIS — F330 Major depressive disorder, recurrent, mild: Secondary | ICD-10-CM | POA: Diagnosis not present

## 2022-04-18 DIAGNOSIS — E559 Vitamin D deficiency, unspecified: Secondary | ICD-10-CM | POA: Diagnosis not present

## 2022-04-18 DIAGNOSIS — Z0000 Encounter for general adult medical examination without abnormal findings: Secondary | ICD-10-CM | POA: Diagnosis not present

## 2022-04-18 DIAGNOSIS — E785 Hyperlipidemia, unspecified: Secondary | ICD-10-CM | POA: Diagnosis not present

## 2022-05-17 ENCOUNTER — Ambulatory Visit: Payer: BC Managed Care – PPO | Attending: Audiologist | Admitting: Audiologist

## 2022-05-17 DIAGNOSIS — H903 Sensorineural hearing loss, bilateral: Secondary | ICD-10-CM | POA: Diagnosis not present

## 2022-05-17 NOTE — Procedures (Signed)
  [facility] and [facility] [place], [place]  [address] [phone]  AUDIOLOGICAL  EVALUATION  NAME: Kathryn Garcia     DOB:   04-01-1960      MRN: [id number]                                                                                     DATE: 05/17/2022     REFERENT: Velna Hatchet, MD STATUS: Outpatient DIAGNOSIS: Sensorineural Hearing Loss Bilateral    History: [name] was seen for an audiological evaluation. [name] has difficulty hearing people clearly and hearing the TV. This difficulty began gradually. No pain or pressure reported in either ear. Tinnitus denied for both ears.  Medical history negative for a condition which is a risk factor for hearing loss. No other relevant case history reported.   Evaluation:  Otoscopy showed a clear view of the tympanic membranes, bilaterally Tympanometry results were consistent with normal middle ear function, bilaterally   Audiometric testing was completed using conventional audiometry with supraural transducer. Speech Recognition Thresholds were 30dB in the right ear and 25dB in the left ear. Word Recognition was performed  65dB, scored 100 % in the right ear and 100% in the left ear. Pure tone thresholds show normal hearing 250-2kHz sloping to mild sensorineural hearing loss 3-8kHz in each ear.  Quick Speech in Noise Test (QuickSIN):  list of six sentences with five key words per sentence is presented in four-talker babble noise. The sentences are presented at pre- recorded signal-to-noise ratios which decrease in 5-dB steps from 25 (very easy) to 0 (extremely difficult). [name] scored in mild SNR loss range of 5.5dB.    Results:  The test results were reviewed with [name]. She has a mild hearing loss in the high frequencies. She will miss high pitched consonant sounds. This will make people sound like they are mumbling.    Recommendations: Hearing loss is mild. Patient given a handout of over  the counter hearing aids that can be used when needed.  Try using a [employer] or TV Ears to hear TV clearly. Improving TV sound quality helps more than volume.  Use closed captions when needed.  Speak face to face to understand.  Return annually to monitor hearing loss.   34 minutes spent testing and counseling on results.   Alfonse Alpers  Audiologist, Au.D., CCC-A 05/17/2022  5:49 PM  Cc: Velna Hatchet, MD

## 2022-07-20 ENCOUNTER — Other Ambulatory Visit: Payer: Self-pay | Admitting: Obstetrics and Gynecology

## 2022-07-20 DIAGNOSIS — Z1231 Encounter for screening mammogram for malignant neoplasm of breast: Secondary | ICD-10-CM

## 2022-08-09 ENCOUNTER — Ambulatory Visit
Admission: RE | Admit: 2022-08-09 | Discharge: 2022-08-09 | Disposition: A | Discharge status: Home / Self Care | Payer: BC Managed Care – PPO | Source: Ambulatory Visit | Attending: Obstetrics and Gynecology | Admitting: Obstetrics and Gynecology

## 2022-08-09 DIAGNOSIS — Z1231 Encounter for screening mammogram for malignant neoplasm of breast: Secondary | ICD-10-CM

## 2022-12-26 DIAGNOSIS — R030 Elevated blood-pressure reading, without diagnosis of hypertension: Secondary | ICD-10-CM | POA: Diagnosis not present

## 2022-12-26 DIAGNOSIS — J069 Acute upper respiratory infection, unspecified: Secondary | ICD-10-CM | POA: Diagnosis not present

## 2022-12-26 DIAGNOSIS — R051 Acute cough: Secondary | ICD-10-CM | POA: Diagnosis not present

## 2023-09-16 ENCOUNTER — Other Ambulatory Visit: Payer: Self-pay | Admitting: Obstetrics and Gynecology

## 2023-09-16 DIAGNOSIS — Z1231 Encounter for screening mammogram for malignant neoplasm of breast: Secondary | ICD-10-CM

## 2023-09-26 ENCOUNTER — Ambulatory Visit
Admission: RE | Admit: 2023-09-26 | Discharge: 2023-09-26 | Disposition: A | Discharge status: Home / Self Care | Payer: PRIVATE HEALTH INSURANCE | Source: Ambulatory Visit | Attending: Obstetrics and Gynecology | Admitting: Obstetrics and Gynecology

## 2023-09-26 DIAGNOSIS — Z1231 Encounter for screening mammogram for malignant neoplasm of breast: Secondary | ICD-10-CM

## 2023-12-08 IMAGING — MG MM DIGITAL SCREENING BILAT W/ TOMO AND CAD
8 series · 8 of 24 positions shown · non-contrast
Comparison: Previous exam(s).

CLINICAL DATA: Screening.

EXAM:
DIGITAL SCREENING BILATERAL MAMMOGRAM WITH TOMOSYNTHESIS AND CAD
TECHNIQUE: Bilateral screening digital craniocaudal and mediolateral oblique
mammograms were obtained. Bilateral screening digital breast
tomosynthesis was performed. The images were evaluated with
computer-aided detection.

[L MLO synth-2D]
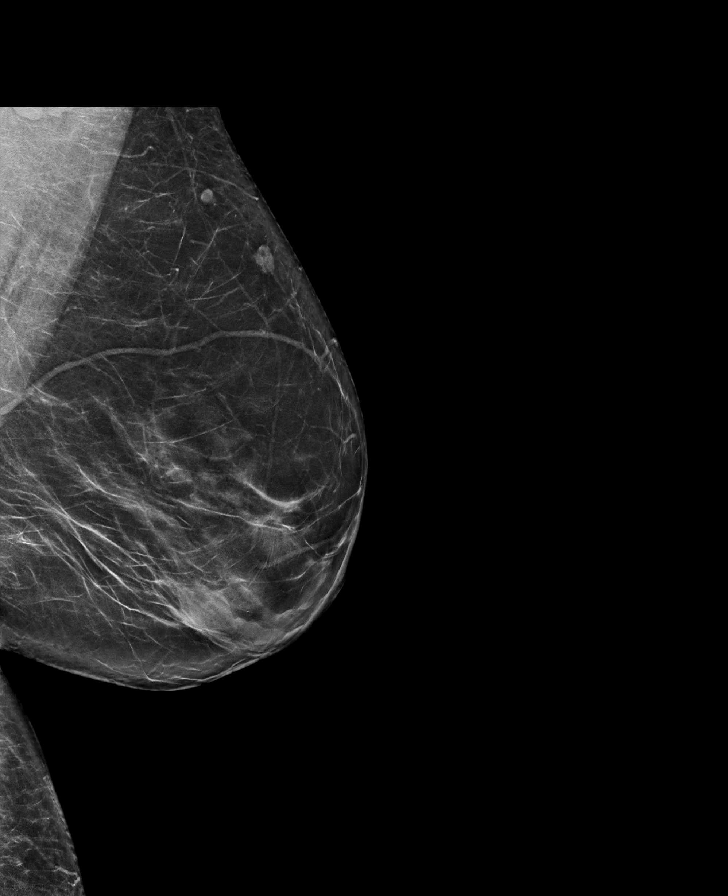

[R MLO synth-2D]
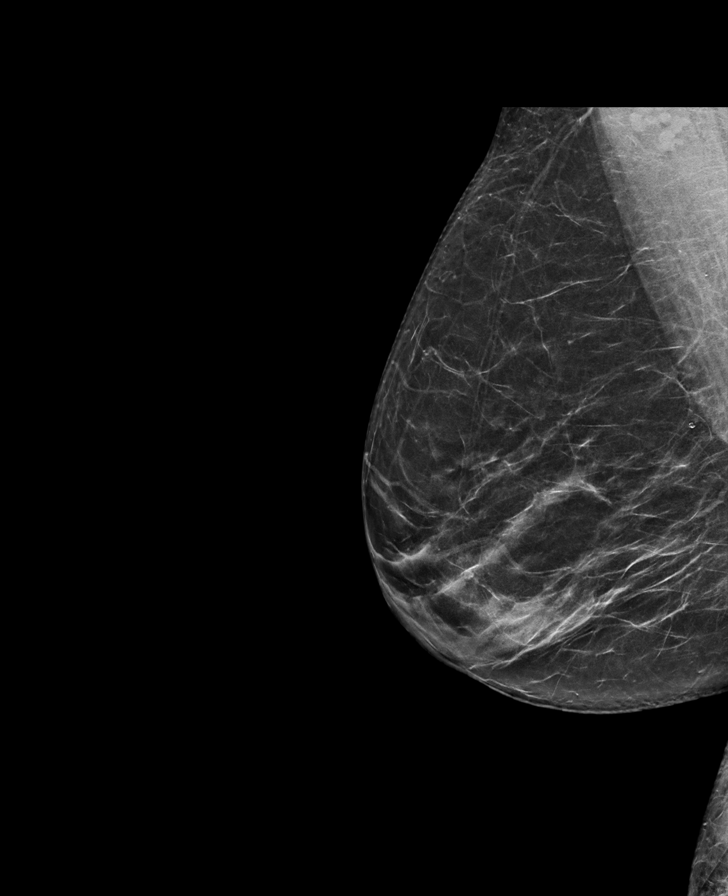

[L CC synth-2D]
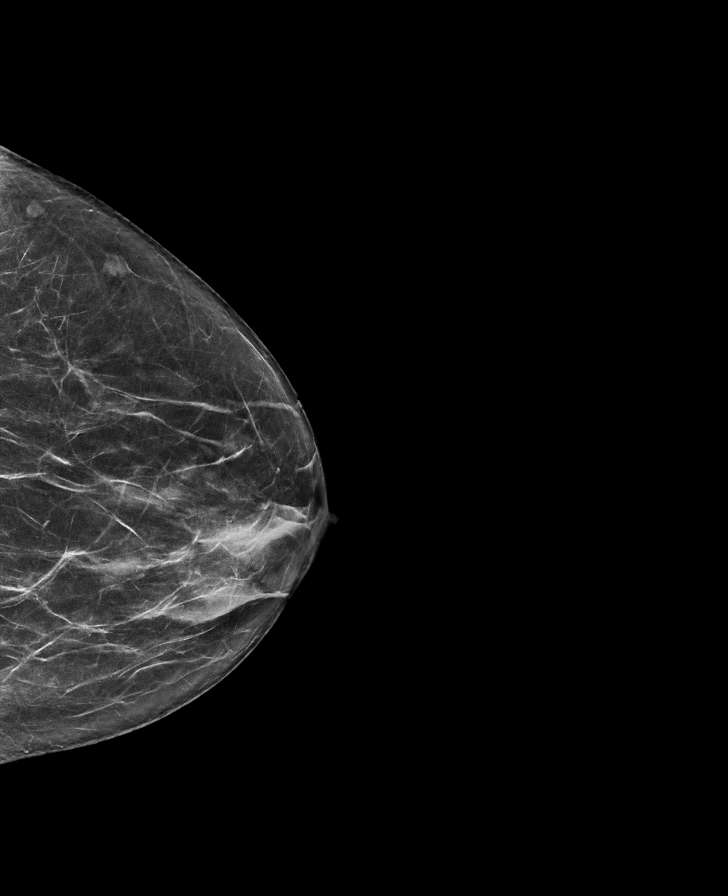

[R CC synth-2D]
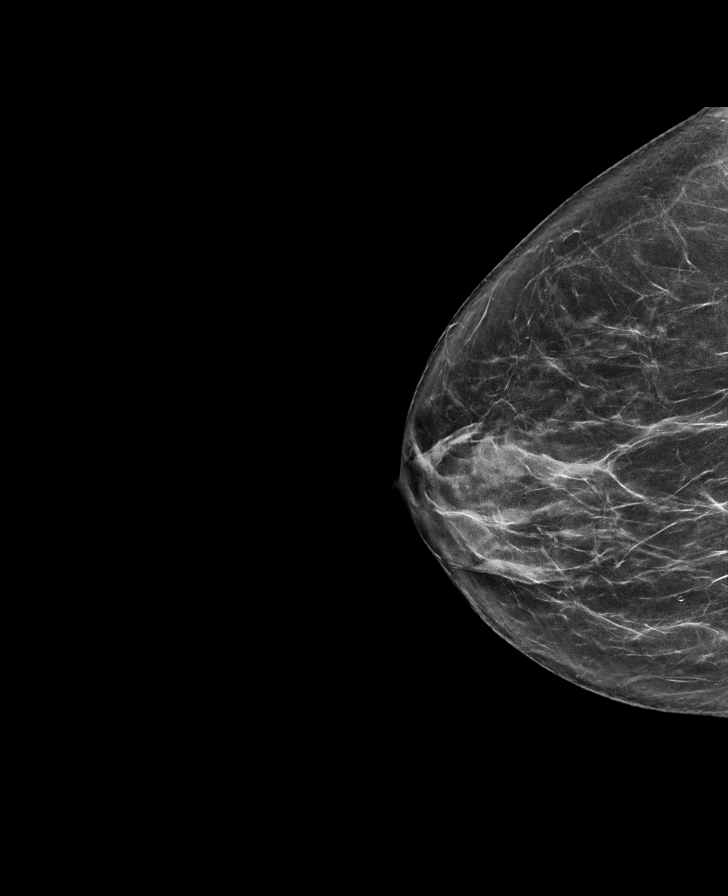

[R CC tomo · tomo slice 35/69.0]
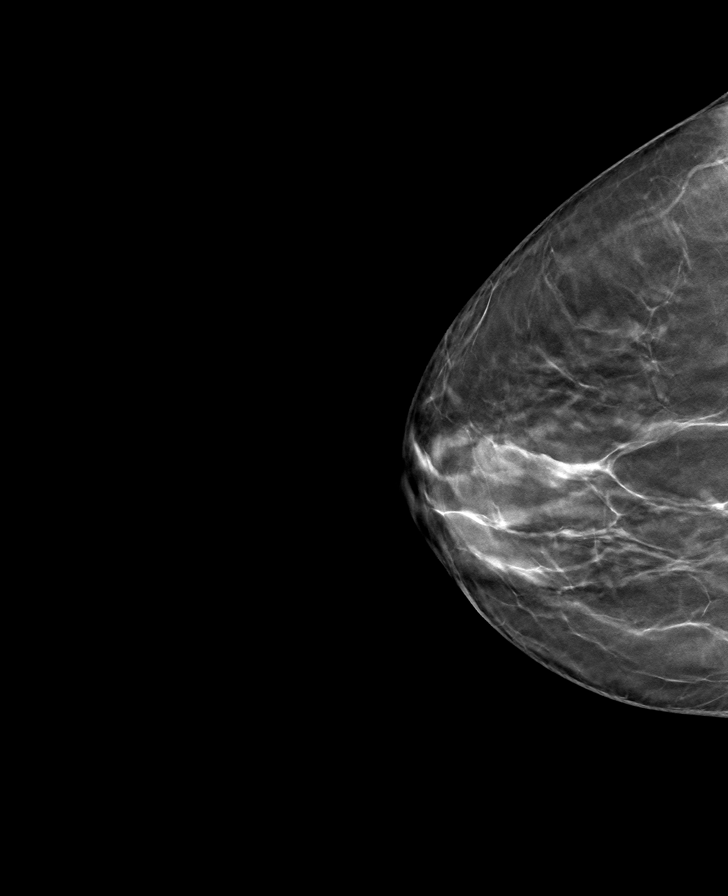

[L MLO tomo · tomo slice 37/74.0]
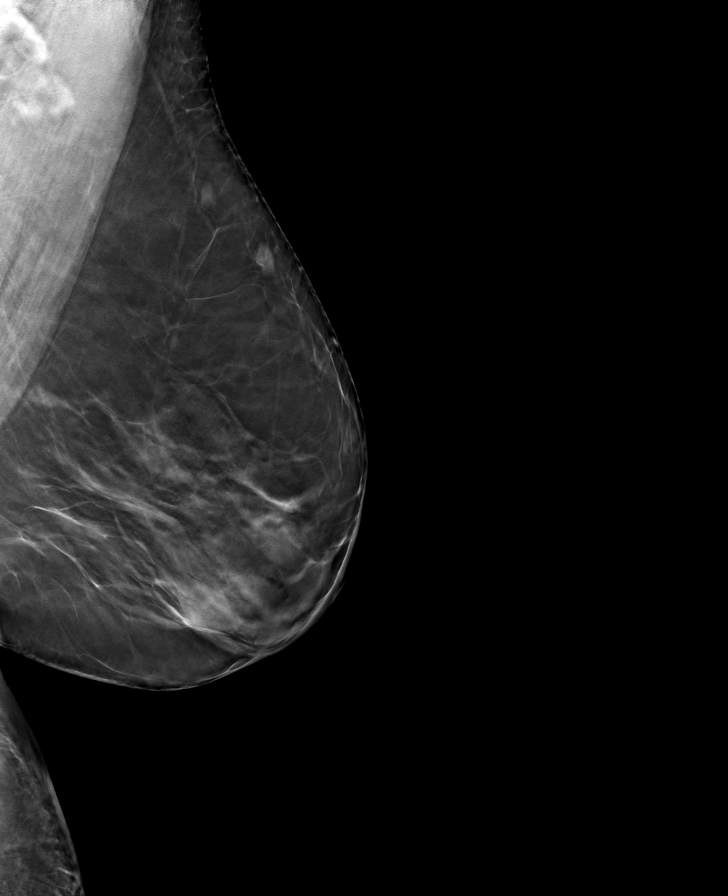

[L CC tomo · tomo slice 35/68.0]
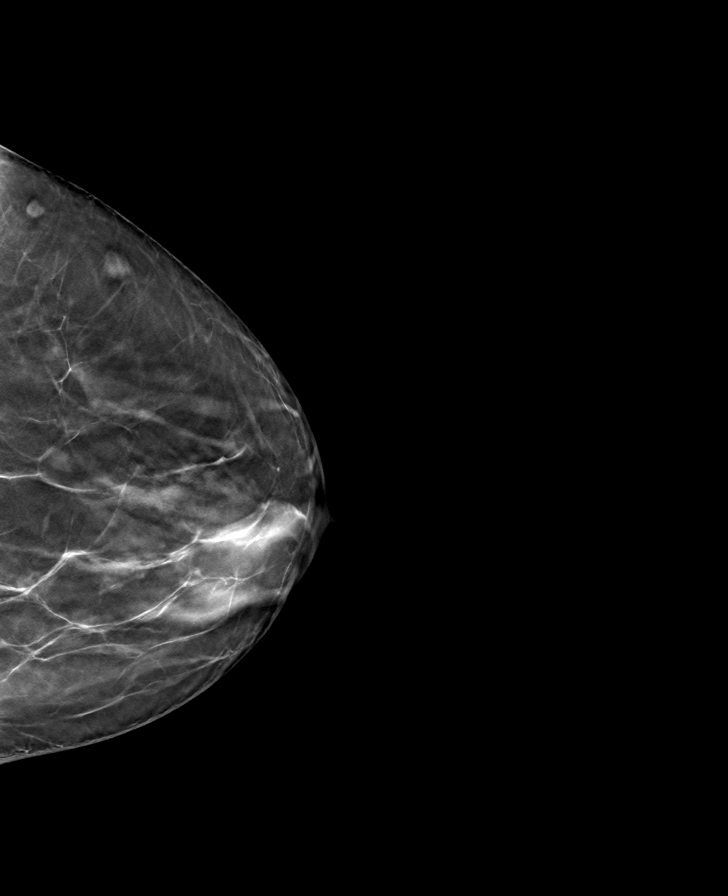

[R MLO tomo · tomo slice 35/70.0]
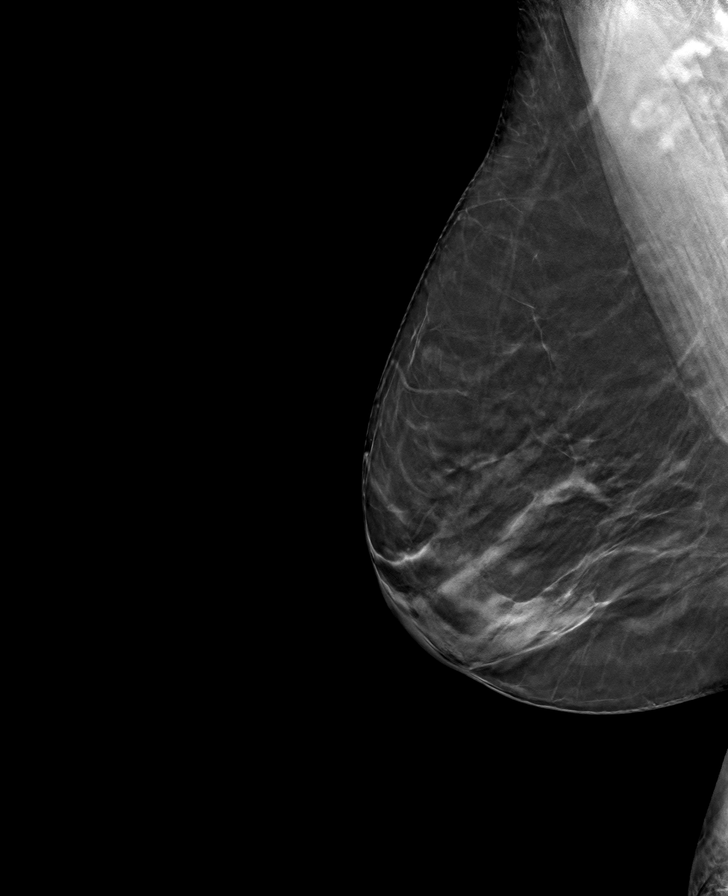

[8 of 24 positions shown; findings below may reference images not displayed]

ACR Breast Density Category c: The breast tissue is heterogeneously
dense, which may obscure small masses.
FINDINGS: There are no findings suspicious for malignancy.
IMPRESSION: No mammographic evidence of malignancy. A result letter of this
screening mammogram will be mailed directly to the patient.

RECOMMENDATION:
Screening mammogram in one year. (Code:Q3-W-BC3)

BI-RADS CATEGORY  1: Negative.
